# Patient Record
Sex: Female | Born: 1965
Health system: Southern US, Community
[De-identification: ages and names within clinical notes are randomized; demographics above are authoritative.]

## PROBLEM LIST (undated history)

## (undated) DIAGNOSIS — J189 Pneumonia, unspecified organism: Secondary | ICD-10-CM

## (undated) DIAGNOSIS — G4730 Sleep apnea, unspecified: Secondary | ICD-10-CM

## (undated) DIAGNOSIS — R7303 Prediabetes: Secondary | ICD-10-CM

## (undated) DIAGNOSIS — N189 Chronic kidney disease, unspecified: Secondary | ICD-10-CM

## (undated) DIAGNOSIS — E039 Hypothyroidism, unspecified: Secondary | ICD-10-CM

## (undated) DIAGNOSIS — M1990 Unspecified osteoarthritis, unspecified site: Secondary | ICD-10-CM

## (undated) DIAGNOSIS — K219 Gastro-esophageal reflux disease without esophagitis: Secondary | ICD-10-CM

## (undated) DIAGNOSIS — Z87442 Personal history of urinary calculi: Secondary | ICD-10-CM

## (undated) DIAGNOSIS — E079 Disorder of thyroid, unspecified: Secondary | ICD-10-CM

## (undated) DIAGNOSIS — I10 Essential (primary) hypertension: Secondary | ICD-10-CM

## (undated) HISTORY — PX: TUBAL LIGATION: SHX77

## (undated) HISTORY — PX: BREAST LUMPECTOMY: SHX2

## (undated) HISTORY — PX: ABDOMINAL HYSTERECTOMY: SHX81

## (undated) HISTORY — PX: WISDOM TOOTH EXTRACTION: SHX21

---

## 1998-06-23 ENCOUNTER — Emergency Department (HOSPITAL_COMMUNITY): Admission: EM | Admit: 1998-06-23 | Discharge: 1998-06-23 | Payer: Self-pay | Admitting: Emergency Medicine

## 1999-11-07 ENCOUNTER — Emergency Department (HOSPITAL_COMMUNITY): Admission: EM | Admit: 1999-11-07 | Discharge: 1999-11-08 | Payer: Self-pay | Admitting: Emergency Medicine

## 1999-11-08 ENCOUNTER — Encounter: Payer: Self-pay | Admitting: Emergency Medicine

## 1999-12-03 ENCOUNTER — Ambulatory Visit (HOSPITAL_COMMUNITY): Admission: RE | Admit: 1999-12-03 | Discharge: 1999-12-03 | Payer: Self-pay | Admitting: Endocrinology

## 1999-12-03 ENCOUNTER — Encounter: Payer: Self-pay | Admitting: Endocrinology

## 2000-07-07 ENCOUNTER — Encounter: Payer: Self-pay | Admitting: Endocrinology

## 2000-07-07 ENCOUNTER — Ambulatory Visit (HOSPITAL_COMMUNITY): Admission: RE | Admit: 2000-07-07 | Discharge: 2000-07-07 | Payer: Self-pay | Admitting: Endocrinology

## 2000-07-28 ENCOUNTER — Other Ambulatory Visit: Admission: RE | Admit: 2000-07-28 | Discharge: 2000-07-28 | Payer: Self-pay | Admitting: *Deleted

## 2000-08-05 ENCOUNTER — Encounter: Payer: Self-pay | Admitting: Endocrinology

## 2000-08-05 ENCOUNTER — Ambulatory Visit (HOSPITAL_COMMUNITY): Admission: RE | Admit: 2000-08-05 | Discharge: 2000-08-05 | Payer: Self-pay | Admitting: Endocrinology

## 2005-02-02 ENCOUNTER — Emergency Department (HOSPITAL_COMMUNITY): Admission: EM | Admit: 2005-02-02 | Discharge: 2005-02-02 | Payer: Self-pay | Admitting: *Deleted

## 2005-04-12 ENCOUNTER — Emergency Department (HOSPITAL_COMMUNITY): Admission: EM | Admit: 2005-04-12 | Discharge: 2005-04-12 | Payer: Self-pay | Admitting: Emergency Medicine

## 2008-03-17 ENCOUNTER — Emergency Department (HOSPITAL_BASED_OUTPATIENT_CLINIC_OR_DEPARTMENT_OTHER): Admission: EM | Admit: 2008-03-17 | Discharge: 2008-03-17 | Payer: Self-pay | Admitting: Emergency Medicine

## 2010-04-07 ENCOUNTER — Emergency Department (HOSPITAL_BASED_OUTPATIENT_CLINIC_OR_DEPARTMENT_OTHER): Admission: EM | Admit: 2010-04-07 | Discharge: 2010-04-07 | Payer: Self-pay | Admitting: Emergency Medicine

## 2011-02-05 LAB — URINALYSIS, ROUTINE W REFLEX MICROSCOPIC
Glucose, UA: NEGATIVE
Ketones, ur: 15 — AB
Nitrite: NEGATIVE
Protein, ur: 30 — AB
Specific Gravity, Urine: 1.027
Urobilinogen, UA: 1
pH: 6

## 2011-02-05 LAB — URINE MICROSCOPIC-ADD ON

## 2012-07-21 ENCOUNTER — Emergency Department (HOSPITAL_BASED_OUTPATIENT_CLINIC_OR_DEPARTMENT_OTHER)
Admission: EM | Admit: 2012-07-21 | Discharge: 2012-07-21 | Disposition: A | Discharge status: Home / Self Care | Payer: 59 | Attending: Emergency Medicine | Admitting: Emergency Medicine

## 2012-07-21 ENCOUNTER — Encounter (HOSPITAL_BASED_OUTPATIENT_CLINIC_OR_DEPARTMENT_OTHER): Payer: Self-pay | Admitting: Emergency Medicine

## 2012-07-21 DIAGNOSIS — K047 Periapical abscess without sinus: Secondary | ICD-10-CM | POA: Insufficient documentation

## 2012-07-21 MED ORDER — HYDROCODONE-ACETAMINOPHEN 5-325 MG PO TABS: 2.0000 | ORAL_TABLET | ORAL | Status: DC | PRN

## 2012-07-21 MED ORDER — CLINDAMYCIN HCL 150 MG PO CAPS: 150.0000 mg | ORAL_CAPSULE | Freq: Four times a day (QID) | ORAL | Status: DC

## 2012-07-21 NOTE — ED Notes (Signed)
Pt states she has right side facial swelling that started today a few hours ago.

## 2012-07-21 NOTE — ED Notes (Signed)
Noted right side facial swelling, Pt states no pain is involved with swelling.

## 2012-07-21 NOTE — ED Provider Notes (Signed)
History     CSN: [id number]  Arrival date & time 07/21/12  1533   First MD Initiated Contact with Patient 07/21/12 1544      Chief Complaint  Patient presents with  . Facial Swelling    (Consider location/radiation/quality/duration/timing/severity/associated sxs/prior treatment) HPI Comments: Pt state that she noticed right sided facial swelling that started this morning:pt denies fever, trouble swallowing or breathing;pt states that she does have a problem with a right lower tooth:pt denies history of similar symptoms:pt denies having tried any medications  The history is provided by the patient. No language interpreter was used.    History reviewed. No pertinent past medical history.  Past Surgical History  Procedure Laterality Date  . Abdominal hysterectomy      Family History  Problem Relation Age of Onset  . Hypertension Mother   . Diabetes Mother   . Hypertension Father     History  Substance Use Topics  . Smoking status: Not on file  . Smokeless tobacco: Not on file  . Alcohol Use: Yes    OB History   Grav Para Term Preterm Abortions TAB SAB Ect Mult Living                  Review of Systems  Constitutional: Negative.   Respiratory: Negative.   Cardiovascular: Negative.     Allergies  Review of patient's allergies indicates not on file.  Home Medications  No current outpatient prescriptions on file.  BP 136/89  Pulse 93  Temp(Src) 98.2 F (36.8 C) (Oral)  Resp 17  Ht 5\' 5"  (1.651 m)  Wt 200 lb (90.719 kg)  BMI 33.28 kg/m2  SpO2 96%  Physical Exam  Nursing note and vitals reviewed. Constitutional: She is oriented to person, place, and time. She appears well-developed and well-nourished.  HENT:  Head: Normocephalic and atraumatic.  Right Ear: External ear normal.  Left Ear: External ear normal.  Mouth/Throat:    Swelling noted along the right lower jaw  Cardiovascular: Normal rate and regular rhythm.   Pulmonary/Chest: Effort  normal and breath sounds normal.  Musculoskeletal: Normal range of motion.  Neurological: She is alert and oriented to person, place, and time.    ED Course  Procedures (including critical care time)  Labs Reviewed - No data to display No results found.   1. Dental abscess       MDM  No sign of ludwigs angina:will treat with clinda and something for pain and refer to dentist        Teressa Lower, NP 07/21/12 1605

## 2012-07-21 NOTE — ED Provider Notes (Signed)
Medical screening examination/treatment/procedure(s) were performed by non-physician practitioner and as supervising physician I was immediately available for consultation/collaboration.   Richardean Canal, MD 07/21/12 [id number]

## 2013-12-20 ENCOUNTER — Encounter: Payer: Self-pay | Admitting: Sports Medicine

## 2013-12-20 ENCOUNTER — Ambulatory Visit (INDEPENDENT_AMBULATORY_CARE_PROVIDER_SITE_OTHER): Payer: 59 | Admitting: Sports Medicine

## 2013-12-20 VITALS — BP 154/97 | HR 93 | Ht 65.0 in | Wt 206.0 lb

## 2013-12-20 DIAGNOSIS — M170 Bilateral primary osteoarthritis of knee: Secondary | ICD-10-CM | POA: Insufficient documentation

## 2013-12-20 DIAGNOSIS — M1710 Unilateral primary osteoarthritis, unspecified knee: Secondary | ICD-10-CM

## 2013-12-20 MED ORDER — DICLOFENAC SODIUM 2 % TD SOLN: 2.0000 | Freq: Two times a day (BID) | TRANSDERMAL | Status: DC

## 2013-12-20 NOTE — Addendum Note (Signed)
Addended by: Monica BectonHEKKEKANDAM, Tanaya Dunigan J on: 12/20/2013 12:00 PM   Modules accepted: Orders

## 2013-12-20 NOTE — Progress Notes (Signed)
   Subjective:    I'm seeing this patient as a consultation for:  Dr. Carlynn PurlPerez  CC: Bilateral knee pain  HPI: This is a very pleasant 48 year old female, she's had several years of knee pain, bilateral, localized to the joint lines, moderate, persistent without radiation, with crepitus. No trauma. [place]. Mobic, ibuprofen, and Aleve were ineffective.  Past medical history, Surgical history, Family history not pertinant except as noted below, Social history, Allergies, and medications have been entered into the medical record, reviewed, and no changes needed.   Review of Systems: No headache, visual changes, nausea, vomiting, diarrhea, constipation, dizziness, abdominal pain, skin rash, fevers, chills, night sweats, weight loss, swollen lymph nodes, body aches, joint swelling, muscle aches, chest pain, shortness of breath, mood changes, visual or auditory hallucinations.   Objective:   General: Well Developed, well nourished, and in no acute distress.  Neuro/Psych: Alert and oriented x3, extra-ocular muscles intact, able to move all 4 extremities, sensation grossly intact. Skin: Warm and dry, no rashes noted.  Respiratory: Not using accessory muscles, speaking in full sentences, trachea midline.  Cardiovascular: Pulses palpable, no extremity edema. Abdomen: Does not appear distended. Bilateral Knee: Normal to inspection with no erythema or effusion or obvious bony abnormalities. Minimal effusion in the right knee, tender to palpation at the joint line bilaterally. ROM normal in flexion and extension and lower leg rotation. Ligaments with solid consistent endpoints including ACL, PCL, LCL, MCL. Negative Mcmurray's and provocative meniscal tests. Non painful patellar compression. Patellar and quadriceps tendons unremarkable. Hamstring and quadriceps strength is normal.  Procedure: Real-time Ultrasound Guided Injection of left  knee Device: GE Logiq E  Verbal informed consent obtained.  Time-out conducted.  Noted no overlying erythema, induration, or other signs of local infection.  Skin prepped in a sterile fashion.  Local anesthesia: Topical Ethyl chloride.  With sterile technique and under real time ultrasound guidance:  2 cc kenalog 40, 4 cc lidocaine injected easily into the suprapatellar recess. Completed without difficulty  Pain immediately resolved suggesting accurate placement of the medication.  Advised to call if fevers/chills, erythema, induration, drainage, or persistent bleeding.  Images permanently stored and available for review in the ultrasound unit.  Impression: Technically successful ultrasound guided injection.  Procedure: Real-time Ultrasound Guided Injection of right knee Device: GE Logiq E  Verbal informed consent obtained.  Time-out conducted.  Noted no overlying erythema, induration, or other signs of local infection.  Skin prepped in a sterile fashion.  Local anesthesia: Topical Ethyl chloride.  With sterile technique and under real time ultrasound guidance:  2 cc kenalog 40, 4 cc lidocaine injected easily into the suprapatellar recess. Completed without difficulty  Pain immediately resolved suggesting accurate placement of the medication.  Advised to call if fevers/chills, erythema, induration, drainage, or persistent bleeding.  Images permanently stored and available for review in the ultrasound unit.  Impression: Technically successful ultrasound guided injection.  Impression and Recommendations:   This case required medical decision making of moderate complexity.

## 2013-12-20 NOTE — Assessment & Plan Note (Addendum)
Bilateral injection. Formal physical therapy at the [place] location, return to see me for custom orthotics. Aleve, ibuprofen, Mobic were ineffective, topical diclofenac did help at 1%, I am going to call in 2%.

## 2013-12-21 ENCOUNTER — Telehealth: Payer: Self-pay | Admitting: Sports Medicine

## 2013-12-21 ENCOUNTER — Encounter (HOSPITAL_BASED_OUTPATIENT_CLINIC_OR_DEPARTMENT_OTHER): Payer: Self-pay | Admitting: Emergency Medicine

## 2013-12-21 DIAGNOSIS — F17200 Nicotine dependence, unspecified, uncomplicated: Secondary | ICD-10-CM | POA: Insufficient documentation

## 2013-12-21 DIAGNOSIS — R221 Localized swelling, mass and lump, neck: Secondary | ICD-10-CM | POA: Diagnosis present

## 2013-12-21 DIAGNOSIS — R220 Localized swelling, mass and lump, head: Secondary | ICD-10-CM | POA: Diagnosis present

## 2013-12-21 NOTE — Telephone Encounter (Signed)
Flushing can be a normal physiologic response after steroid injection, sit in a cool room, take some Benadryl. This will resolve.

## 2013-12-21 NOTE — ED Notes (Signed)
Pt reports that she has had burning and swelling in her face since about 2pm today and shortness of breath since about 7pm. Pt denies hives/itching.Took Benadryl at 1830 and Tylenol at 2100. Pt says that yesterday she had two cortisone shots in her knees.

## 2013-12-21 NOTE — Telephone Encounter (Signed)
Ms. Colleen Kemp called. She states she is feeling hot after steroid injection yesterday. I told pt per Dr T that she is feeling flushed from steroid and to take Benadryl.

## 2013-12-22 ENCOUNTER — Emergency Department (HOSPITAL_BASED_OUTPATIENT_CLINIC_OR_DEPARTMENT_OTHER)
Admission: EM | Admit: 2013-12-22 | Discharge: 2013-12-22 | Discharge status: Against Medical Advice | Payer: 59 | Attending: Emergency Medicine | Admitting: Emergency Medicine

## 2013-12-22 NOTE — Telephone Encounter (Signed)
Spoke to patient advised her of the instructions as noted below. Rhonda Cunningham,CMA

## 2013-12-28 ENCOUNTER — Ambulatory Visit (INDEPENDENT_AMBULATORY_CARE_PROVIDER_SITE_OTHER): Payer: 59 | Admitting: Sports Medicine

## 2013-12-28 ENCOUNTER — Encounter: Payer: Self-pay | Admitting: Sports Medicine

## 2013-12-28 VITALS — BP 142/96 | HR 81 | Ht 65.0 in | Wt 207.0 lb

## 2013-12-28 DIAGNOSIS — M170 Bilateral primary osteoarthritis of knee: Secondary | ICD-10-CM

## 2013-12-28 DIAGNOSIS — M1710 Unilateral primary osteoarthritis, unspecified knee: Secondary | ICD-10-CM

## 2013-12-28 NOTE — Progress Notes (Signed)

## 2013-12-28 NOTE — Assessment & Plan Note (Addendum)
Custom orthotics as above. Right knee is pain-free, left knee still has a bit of pain. Has not yet started physical therapy, return in 4 weeks.

## 2014-01-04 ENCOUNTER — Ambulatory Visit: Payer: 59 | Attending: Sports Medicine | Admitting: Physical Therapy

## 2014-01-04 DIAGNOSIS — R269 Unspecified abnormalities of gait and mobility: Secondary | ICD-10-CM | POA: Diagnosis not present

## 2014-01-04 DIAGNOSIS — IMO0001 Reserved for inherently not codable concepts without codable children: Secondary | ICD-10-CM | POA: Diagnosis present

## 2014-01-04 DIAGNOSIS — M25569 Pain in unspecified knee: Secondary | ICD-10-CM | POA: Diagnosis not present

## 2014-01-12 ENCOUNTER — Ambulatory Visit: Payer: 59 | Admitting: Rehabilitation

## 2014-01-12 DIAGNOSIS — IMO0001 Reserved for inherently not codable concepts without codable children: Secondary | ICD-10-CM | POA: Diagnosis not present

## 2014-01-13 ENCOUNTER — Ambulatory Visit: Payer: 59 | Admitting: Physical Therapy

## 2014-01-13 DIAGNOSIS — IMO0001 Reserved for inherently not codable concepts without codable children: Secondary | ICD-10-CM | POA: Diagnosis not present

## 2014-01-17 ENCOUNTER — Ambulatory Visit: Payer: 59 | Admitting: Rehabilitation

## 2014-01-17 DIAGNOSIS — IMO0001 Reserved for inherently not codable concepts without codable children: Secondary | ICD-10-CM | POA: Diagnosis not present

## 2014-01-20 ENCOUNTER — Ambulatory Visit: Payer: 59 | Admitting: Physical Therapy

## 2014-01-24 ENCOUNTER — Ambulatory Visit: Payer: 59 | Admitting: Rehabilitation

## 2014-01-24 DIAGNOSIS — IMO0001 Reserved for inherently not codable concepts without codable children: Secondary | ICD-10-CM | POA: Diagnosis not present

## 2014-01-27 ENCOUNTER — Ambulatory Visit: Payer: 59 | Admitting: Physical Therapy

## 2014-01-28 ENCOUNTER — Ambulatory Visit (INDEPENDENT_AMBULATORY_CARE_PROVIDER_SITE_OTHER): Payer: 59 | Admitting: Sports Medicine

## 2014-01-28 ENCOUNTER — Encounter: Payer: Self-pay | Admitting: Sports Medicine

## 2014-01-28 VITALS — BP 151/95 | HR 101 | Ht 65.0 in | Wt 205.0 lb

## 2014-01-28 DIAGNOSIS — M1710 Unilateral primary osteoarthritis, unspecified knee: Secondary | ICD-10-CM

## 2014-01-28 DIAGNOSIS — M170 Bilateral primary osteoarthritis of knee: Secondary | ICD-10-CM

## 2014-01-28 MED ORDER — TRAMADOL HCL 50 MG PO TABS: ORAL_TABLET | ORAL | Status: DC

## 2014-01-28 NOTE — Assessment & Plan Note (Signed)
Is doing okay after injection, orthotics, physical therapy. Left knee still has some pain. Next step would be Visco supplementation, she is going to [place] on Wednesday so I am happy to inject her knee on Tuesday. She returns we can start Visco supplementation.  Tramadol for pain in the meantime.

## 2014-01-28 NOTE — Progress Notes (Signed)
  Subjective:    CC: Followup  HPI: Bilateral knee osteoarthritis: Right knee is pain-free after injection and orthotics, left knee continues to have pain. Pain is moderate, persistent. She does have a trip coming up to [place] next week, pain is moderate, persistent.  Past medical history, Surgical history, Family history not pertinant except as noted below, Social history, Allergies, and medications have been entered into the medical record, reviewed, and no changes needed.   Review of Systems: No fevers, chills, night sweats, weight loss, chest pain, or shortness of breath.   Objective:    General: Well Developed, well nourished, and in no acute distress.  Neuro: Alert and oriented x3, extra-ocular muscles intact, sensation grossly intact.  HEENT: Normocephalic, atraumatic, pupils equal round reactive to light, neck supple, no masses, no lymphadenopathy, thyroid nonpalpable.  Skin: Warm and dry, no rashes. Cardiac: Regular rate and rhythm, no murmurs rubs or gallops, no lower extremity edema.  Respiratory: Clear to auscultation bilaterally. Not using accessory muscles, speaking in full sentences.  Impression and Recommendations:

## 2014-01-31 ENCOUNTER — Ambulatory Visit: Payer: 59 | Admitting: Rehabilitation

## 2014-02-01 ENCOUNTER — Ambulatory Visit (INDEPENDENT_AMBULATORY_CARE_PROVIDER_SITE_OTHER): Payer: 59 | Admitting: Sports Medicine

## 2014-02-01 ENCOUNTER — Encounter: Payer: Self-pay | Admitting: Sports Medicine

## 2014-02-01 ENCOUNTER — Ambulatory Visit: Payer: 59 | Admitting: Sports Medicine

## 2014-02-01 VITALS — BP 147/85 | HR 93 | Wt 205.0 lb

## 2014-02-01 DIAGNOSIS — M1710 Unilateral primary osteoarthritis, unspecified knee: Secondary | ICD-10-CM

## 2014-02-01 DIAGNOSIS — M170 Bilateral primary osteoarthritis of knee: Secondary | ICD-10-CM

## 2014-02-01 NOTE — Progress Notes (Signed)
  Subjective:    CC: Followup  HPI: Bilateral knee osteoarthritis: Right knee is doing okay, left knee still hurts. She is going to [place] tomorrow, she desires repeat knee injection today. She is also a candidate now for Visco supplementation. Pain is worse in the morning, with gelling, moderate, persistent and worse at the joint line. No radiation.  Past medical history, Surgical history, Family history not pertinant except as noted below, Social history, Allergies, and medications have been entered into the medical record, reviewed, and no changes needed.   Review of Systems: No fevers, chills, night sweats, weight loss, chest pain, or shortness of breath.   Objective:    General: Well Developed, well nourished, and in no acute distress.  Neuro: Alert and oriented x3, extra-ocular muscles intact, sensation grossly intact.  HEENT: Normocephalic, atraumatic, pupils equal round reactive to light, neck supple, no masses, no lymphadenopathy, thyroid nonpalpable.  Skin: Warm and dry, no rashes. Cardiac: Regular rate and rhythm, no murmurs rubs or gallops, no lower extremity edema.  Respiratory: Clear to auscultation bilaterally. Not using accessory muscles, speaking in full sentences.  Procedure: Real-time Ultrasound Guided aspiration/Injection of left knee Device: GE Logiq E  Verbal informed consent obtained.  Time-out conducted.  Noted no overlying erythema, induration, or other signs of local infection.  Skin prepped in a sterile fashion.  Local anesthesia: Topical Ethyl chloride.  With sterile technique and under real time ultrasound guidance:  20 cc of straw-colored fluid aspirated, syringe switched in 2 cc Kenalog 40, 4 cc lidocaine injected easily, syringe again switched and 30 mg/2 mL of OrthoVisc (sodium hyaluronate) in a prefilled syringe was injected easily into the knee through a 22-gauge needle. Completed without difficulty  Pain immediately resolved suggesting accurate  placement of the medication.  Advised to call if fevers/chills, erythema, induration, drainage, or persistent bleeding.  Images permanently stored and available for review in the ultrasound unit.  Impression: Technically successful ultrasound guided injection.  Impression and Recommendations:

## 2014-02-01 NOTE — Assessment & Plan Note (Signed)
Aspiration and steroid injection, OrthoVisc injection #1. Return in one week for #2. She is going to [place] for the next week.

## 2014-02-03 ENCOUNTER — Ambulatory Visit: Payer: 59 | Admitting: Physical Therapy

## 2014-02-07 ENCOUNTER — Ambulatory Visit: Payer: 59 | Attending: Sports Medicine | Admitting: Rehabilitation

## 2014-02-07 DIAGNOSIS — M25561 Pain in right knee: Secondary | ICD-10-CM | POA: Insufficient documentation

## 2014-02-07 DIAGNOSIS — R269 Unspecified abnormalities of gait and mobility: Secondary | ICD-10-CM | POA: Insufficient documentation

## 2014-02-07 DIAGNOSIS — M25562 Pain in left knee: Secondary | ICD-10-CM | POA: Insufficient documentation

## 2014-02-07 DIAGNOSIS — Z5189 Encounter for other specified aftercare: Secondary | ICD-10-CM | POA: Insufficient documentation

## 2014-02-08 ENCOUNTER — Ambulatory Visit: Payer: 59 | Admitting: Sports Medicine

## 2014-02-10 ENCOUNTER — Ambulatory Visit: Payer: 59 | Admitting: Physical Therapy

## 2014-02-14 ENCOUNTER — Encounter: Payer: Self-pay | Admitting: Sports Medicine

## 2014-02-14 ENCOUNTER — Ambulatory Visit (INDEPENDENT_AMBULATORY_CARE_PROVIDER_SITE_OTHER): Payer: 59 | Admitting: Sports Medicine

## 2014-02-14 ENCOUNTER — Ambulatory Visit: Payer: 59 | Admitting: Rehabilitation

## 2014-02-14 VITALS — BP 137/90 | HR 87 | Ht 65.0 in | Wt 206.0 lb

## 2014-02-14 DIAGNOSIS — M170 Bilateral primary osteoarthritis of knee: Secondary | ICD-10-CM

## 2014-02-14 NOTE — Assessment & Plan Note (Signed)
She was pain-free during her to make a trip. Aspiration and OrthoVisc injection #2 of 4 into the left knee. Return in one week for #3.

## 2014-02-14 NOTE — Progress Notes (Signed)
  Procedure: Real-time Ultrasound Guided aspiration/Injection of left knee Device: GE Logiq E  Verbal informed consent obtained.  Time-out conducted.  Noted no overlying erythema, induration, or other signs of local infection.  Skin prepped in a sterile fashion.  Local anesthesia: Topical Ethyl chloride.  With sterile technique and under real time ultrasound guidance:  Aspirated 15 cc fo of straw-colored fluid, syringe switched and 30 mg/2 mL of OrthoVisc (sodium hyaluronate) in a prefilled syringe was injected easily into the knee through a 22-gauge needle. Completed without difficulty  Pain immediately resolved suggesting accurate placement of the medication.  Advised to call if fevers/chills, erythema, induration, drainage, or persistent bleeding.  Images permanently stored and available for review in the ultrasound unit.  Impression: Technically successful ultrasound guided injection.

## 2014-02-17 ENCOUNTER — Ambulatory Visit: Payer: 59 | Admitting: Physical Therapy

## 2014-02-21 ENCOUNTER — Ambulatory Visit (INDEPENDENT_AMBULATORY_CARE_PROVIDER_SITE_OTHER): Payer: 59 | Admitting: Sports Medicine

## 2014-02-21 ENCOUNTER — Encounter: Payer: Self-pay | Admitting: Sports Medicine

## 2014-02-21 VITALS — BP 146/96 | HR 90 | Ht 65.0 in | Wt 207.0 lb

## 2014-02-21 DIAGNOSIS — M170 Bilateral primary osteoarthritis of knee: Secondary | ICD-10-CM

## 2014-02-21 NOTE — Progress Notes (Signed)
  Procedure: Real-time Ultrasound Guided aspiration/Injection of left knee Device: GE Logiq E  Verbal informed consent obtained.  Time-out conducted.  Noted no overlying erythema, induration, or other signs of local infection.  Skin prepped in a sterile fashion.  Local anesthesia: Topical Ethyl chloride.  With sterile technique and under real time ultrasound guidance:  Aspirated 25 cc fo of straw-colored fluid, syringe switched and 30 mg/2 mL of OrthoVisc (sodium hyaluronate) in a prefilled syringe was injected easily into the knee through a 22-gauge needle. Completed without difficulty  Pain immediately resolved suggesting accurate placement of the medication.  Advised to call if fevers/chills, erythema, induration, drainage, or persistent bleeding.  Images permanently stored and available for review in the ultrasound unit.  Impression: Technically successful ultrasound guided injection.

## 2014-02-21 NOTE — Assessment & Plan Note (Addendum)
Aspiration and orthovisc injection number 3 into the left knee. Currently pain-free. Return in one week for #4.

## 2014-02-28 ENCOUNTER — Ambulatory Visit (INDEPENDENT_AMBULATORY_CARE_PROVIDER_SITE_OTHER): Payer: 59 | Admitting: Sports Medicine

## 2014-02-28 ENCOUNTER — Encounter: Payer: Self-pay | Admitting: Sports Medicine

## 2014-02-28 VITALS — BP 142/88 | HR 91 | Ht 65.0 in | Wt 203.0 lb

## 2014-02-28 DIAGNOSIS — M170 Bilateral primary osteoarthritis of knee: Secondary | ICD-10-CM

## 2014-02-28 NOTE — Progress Notes (Signed)
  Procedure: Real-time Ultrasound Guided aspiration/Injection of left knee Device: GE Logiq E  Verbal informed consent obtained.  Time-out conducted.  Noted no overlying erythema, induration, or other signs of local infection.  Skin prepped in a sterile fashion.  Local anesthesia: Topical Ethyl chloride.  With sterile technique and under real time ultrasound guidance:  Aspirated 12 cc of straw-colored fluid, syringe switched and 30 mg/2 mL of OrthoVisc (sodium hyaluronate) in a prefilled syringe was injected easily into the knee through a 22-gauge needle. Completed without difficulty  Pain immediately resolved suggesting accurate placement of the medication.  Advised to call if fevers/chills, erythema, induration, drainage, or persistent bleeding.  Images permanently stored and available for review in the ultrasound unit.  Impression: Technically successful ultrasound guided injection.

## 2014-02-28 NOTE — Assessment & Plan Note (Signed)
Aspiration and Orthovisc injection #4 of 4, currently pain-free. Return as needed.

## 2014-03-11 ENCOUNTER — Telehealth: Payer: Self-pay

## 2014-03-11 MED ORDER — IBUPROFEN 800 MG PO TABS: 800.0000 mg | ORAL_TABLET | Freq: Three times a day (TID) | ORAL | Status: DC | PRN

## 2014-03-11 NOTE — Telephone Encounter (Signed)
Ibuprofen 800 mg called in.

## 2014-03-11 NOTE — Telephone Encounter (Signed)
Patient called state that Tramadol is not working for her she would like to have her Rx changed from Tramadol to Ibuprofen. Jefry Lesinski,CMA

## 2014-03-11 NOTE — Telephone Encounter (Signed)
Spoke to patient gave her information as noted below. Colleen Kemp,CMA  

## 2014-10-26 ENCOUNTER — Encounter: Payer: Self-pay | Admitting: Sports Medicine

## 2014-10-26 ENCOUNTER — Ambulatory Visit (INDEPENDENT_AMBULATORY_CARE_PROVIDER_SITE_OTHER): Payer: 59 | Admitting: Sports Medicine

## 2014-10-26 VITALS — BP 149/105 | HR 85 | Ht 66.5 in | Wt 215.0 lb

## 2014-10-26 DIAGNOSIS — M170 Bilateral primary osteoarthritis of knee: Secondary | ICD-10-CM | POA: Diagnosis not present

## 2014-10-26 NOTE — Assessment & Plan Note (Signed)
Bilateral knee joint aspiration, steroidal injection and/or the Visco injection as above. Return in one week for bilateral Orthovisc injection #2 of 4.

## 2014-10-26 NOTE — Progress Notes (Signed)
  Subjective:    CC: bilateral knee pain  HPI: This is a pleasant 49 year old female, she has bilateral knee osteoarthritis, her right knee did well initially with a steroidal injection over a year ago and her left needed well with viscous supplementation. She now has swelling and pain in both knees, moderate, persistent, at the joint lines, no radiation. She is amenable to proceed with interventional treatment for both knees.  Past medical history, Surgical history, Family history not pertinant except as noted below, Social history, Allergies, and medications have been entered into the medical record, reviewed, and no changes needed.   Review of Systems: No fevers, chills, night sweats, weight loss, chest pain, or shortness of breath.   Objective:    General: Well Developed, well nourished, and in no acute distress.  Neuro: Alert and oriented x3, extra-ocular muscles intact, sensation grossly intact.  HEENT: Normocephalic, atraumatic, pupils equal round reactive to light, neck supple, no masses, no lymphadenopathy, thyroid nonpalpable.  Skin: Warm and dry, no rashes. Cardiac: Regular rate and rhythm, no murmurs rubs or gallops, no lower extremity edema.  Respiratory: Clear to auscultation bilaterally. Not using accessory muscles, speaking in full sentences. Bilateral Knees: Visibly swollen with a palpable fluid wave and tenderness at the medial joint lines ROM normal in flexion and extension and lower leg rotation. Ligaments with solid consistent endpoints including ACL, PCL, LCL, MCL. Negative Mcmurray's and provocative meniscal tests. Non painful patellar compression. Patellar and quadriceps tendons unremarkable. Hamstring and quadriceps strength is normal.  Procedure: Real-time Ultrasound Guided Injection of left knee Device: GE Logiq E  Verbal informed consent obtained.  Time-out conducted.  Noted no overlying erythema, induration, or other signs of local infection.  Skin  prepped in a sterile fashion.  Local anesthesia: Topical Ethyl chloride.  With sterile technique and under real time ultrasound guidance:  Aspirated 45 mL of straw-colored fluid, syringe switched and 1 mL kenalog 40, 4 mL lidocaine injected easily, syringe again switched and 30 mg/2 mL of OrthoVisc (sodium hyaluronate) in a prefilled syringe was injected easily into the knee through a 22-gauge needle. Completed without difficulty  Pain immediately resolved suggesting accurate placement of the medication.  Advised to call if fevers/chills, erythema, induration, drainage, or persistent bleeding.  Images permanently stored and available for review in the ultrasound unit.  Impression: Technically successful ultrasound guided injection.  Procedure: Real-time Ultrasound Guided Injection of right knee Device: GE Logiq E  Verbal informed consent obtained.  Time-out conducted.  Noted no overlying erythema, induration, or other signs of local infection.  Skin prepped in a sterile fashion.  Local anesthesia: Topical Ethyl chloride.  With sterile technique and under real time ultrasound guidance:  Aspirated 57mL of straw-colored fluid, syringe switched and 1 mL kenalog 40, 4 mL lidocaine injected easily, syringe again switched and 30 mg/2 mL of OrthoVisc (sodium hyaluronate) in a prefilled syringe was injected easily into the knee through a 22-gauge needle. Completed without difficulty  Pain immediately resolved suggesting accurate placement of the medication.  Advised to call if fevers/chills, erythema, induration, drainage, or persistent bleeding.  Images permanently stored and available for review in the ultrasound unit.  Impression: Technically successful ultrasound guided injection.  Impression and Recommendations:

## 2014-11-03 ENCOUNTER — Ambulatory Visit (INDEPENDENT_AMBULATORY_CARE_PROVIDER_SITE_OTHER): Payer: 59 | Admitting: Sports Medicine

## 2014-11-03 ENCOUNTER — Encounter: Payer: Self-pay | Admitting: Sports Medicine

## 2014-11-03 VITALS — BP 147/96 | HR 81 | Ht 65.5 in | Wt 211.0 lb

## 2014-11-03 DIAGNOSIS — M170 Bilateral primary osteoarthritis of knee: Secondary | ICD-10-CM | POA: Diagnosis not present

## 2014-11-03 NOTE — Assessment & Plan Note (Signed)
Orthovisc injection #2 of 4 into each knee. Return in one week for #3

## 2014-11-03 NOTE — Progress Notes (Signed)
  Procedure: Real-time Ultrasound Guided Injection of left knee Device: GE Logiq E  Verbal informed consent obtained.  Time-out conducted.  Noted no overlying erythema, induration, or other signs of local infection.  Skin prepped in a sterile fashion.  Local anesthesia: Topical Ethyl chloride.  With sterile technique and under real time ultrasound guidance:  Aspirated 20 mL of straw-colored fluid, syringe switched and  30 mg/2 mL of OrthoVisc (sodium hyaluronate) in a prefilled syringe was injected easily into the knee through a 22-gauge needle. Completed without difficulty  Pain immediately resolved suggesting accurate placement of the medication.  Advised to call if fevers/chills, erythema, induration, drainage, or persistent bleeding.  Images permanently stored and available for review in the ultrasound unit.  Impression: Technically successful ultrasound guided injection.  Procedure: Real-time Ultrasound Guided Injection of right knee Device: GE Logiq E  Verbal informed consent obtained.  Time-out conducted.  Noted no overlying erythema, induration, or other signs of local infection.  Skin prepped in a sterile fashion.  Local anesthesia: Topical Ethyl chloride.  With sterile technique and under real time ultrasound guidance:  Aspirated 15mL of straw-colored fluid, syringe switched and  30 mg/2 mL of OrthoVisc (sodium hyaluronate) in a prefilled syringe was injected easily into the knee through a 22-gauge needle. Completed without difficulty  Pain immediately resolved suggesting accurate placement of the medication.  Advised to call if fevers/chills, erythema, induration, drainage, or persistent bleeding.  Images permanently stored and available for review in the ultrasound unit.  Impression: Technically successful ultrasound guided injection.

## 2014-11-11 ENCOUNTER — Ambulatory Visit (INDEPENDENT_AMBULATORY_CARE_PROVIDER_SITE_OTHER): Payer: 59 | Admitting: Sports Medicine

## 2014-11-11 ENCOUNTER — Encounter: Payer: Self-pay | Admitting: Sports Medicine

## 2014-11-11 DIAGNOSIS — M170 Bilateral primary osteoarthritis of knee: Secondary | ICD-10-CM

## 2014-11-11 MED ORDER — IBUPROFEN 800 MG PO TABS: 800.0000 mg | ORAL_TABLET | Freq: Three times a day (TID) | ORAL | Status: DC | PRN

## 2014-11-11 NOTE — Progress Notes (Signed)
  Procedure: Real-time Ultrasound Guided Injection of left knee Device: GE Logiq E  Verbal informed consent obtained.  Time-out conducted.  Noted no overlying erythema, induration, or other signs of local infection.  Skin prepped in a sterile fashion.  Local anesthesia: Topical Ethyl chloride.  With sterile technique and under real time ultrasound guidance:  Aspirated 17 mL of straw-colored fluid, syringe switched and  30 mg/2 mL of OrthoVisc (sodium hyaluronate) in a prefilled syringe was injected easily into the knee through a 22-gauge needle. Completed without difficulty  Pain immediately resolved suggesting accurate placement of the medication.  Advised to call if fevers/chills, erythema, induration, drainage, or persistent bleeding.  Images permanently stored and available for review in the ultrasound unit.  Impression: Technically successful ultrasound guided injection.  Procedure: Real-time Ultrasound Guided Injection of right knee Device: GE Logiq E  Verbal informed consent obtained.  Time-out conducted.  Noted no overlying erythema, induration, or other signs of local infection.  Skin prepped in a sterile fashion.  Local anesthesia: Topical Ethyl chloride.  With sterile technique and under real time ultrasound guidance:  Aspirated 14mL of straw-colored fluid, syringe switched and  30 mg/2 mL of OrthoVisc (sodium hyaluronate) in a prefilled syringe was injected easily into the knee through a 22-gauge needle. Completed without difficulty  Pain immediately resolved suggesting accurate placement of the medication.  Advised to call if fevers/chills, erythema, induration, drainage, or persistent bleeding.  Images permanently stored and available for review in the ultrasound unit.  Impression: Technically successful ultrasound guided injection.

## 2014-11-11 NOTE — Assessment & Plan Note (Addendum)
Orthovisc injection #3 of 4 into both knees.  Pain relief is currently at 80%. Return in one week for #4.

## 2014-11-17 ENCOUNTER — Ambulatory Visit: Payer: 59 | Admitting: Sports Medicine

## 2014-11-22 ENCOUNTER — Encounter: Payer: Self-pay | Admitting: Sports Medicine

## 2014-11-22 ENCOUNTER — Ambulatory Visit (INDEPENDENT_AMBULATORY_CARE_PROVIDER_SITE_OTHER): Payer: 59 | Admitting: Sports Medicine

## 2014-11-22 DIAGNOSIS — M170 Bilateral primary osteoarthritis of knee: Secondary | ICD-10-CM | POA: Diagnosis not present

## 2014-11-22 MED ORDER — DICLOFENAC SODIUM 75 MG PO TBEC: 75.0000 mg | DELAYED_RELEASE_TABLET | Freq: Two times a day (BID) | ORAL | Status: DC

## 2014-11-22 NOTE — Assessment & Plan Note (Addendum)
Orthovisc injection #4 of 4 into both knees, starting diclofenac. Next line return in one month, arthroscopy if no better.

## 2014-11-22 NOTE — Progress Notes (Signed)
  Procedure: Real-time Ultrasound Guided Injection of left knee Device: GE Logiq E  Verbal informed consent obtained.  Time-out conducted.  Noted no overlying erythema, induration, or other signs of local infection.  Skin prepped in a sterile fashion.  Local anesthesia: Topical Ethyl chloride.  With sterile technique and under real time ultrasound guidance:  30 mg/2 mL of OrthoVisc (sodium hyaluronate) in a prefilled syringe was injected easily into the knee through a 22-gauge needle. Completed without difficulty  Pain immediately resolved suggesting accurate placement of the medication.  Advised to call if fevers/chills, erythema, induration, drainage, or persistent bleeding.  Images permanently stored and available for review in the ultrasound unit.  Impression: Technically successful ultrasound guided injection.  Procedure: Real-time Ultrasound Guided Injection of right knee Device: GE Logiq E  Verbal informed consent obtained.  Time-out conducted.  Noted no overlying erythema, induration, or other signs of local infection.  Skin prepped in a sterile fashion.  Local anesthesia: Topical Ethyl chloride.  With sterile technique and under real time ultrasound guidance:  Aspirated 16mL of straw-colored fluid, syringe switched and  30 mg/2 mL of OrthoVisc (sodium hyaluronate) in a prefilled syringe was injected easily into the knee through a 22-gauge needle. Completed without difficulty  Pain immediately resolved suggesting accurate placement of the medication.  Advised to call if fevers/chills, erythema, induration, drainage, or persistent bleeding.  Images permanently stored and available for review in the ultrasound unit.  Impression: Technically successful ultrasound guided injection.

## 2014-12-20 ENCOUNTER — Ambulatory Visit: Payer: 59 | Admitting: Sports Medicine

## 2014-12-21 ENCOUNTER — Encounter: Payer: Self-pay | Admitting: Sports Medicine

## 2014-12-21 ENCOUNTER — Ambulatory Visit (INDEPENDENT_AMBULATORY_CARE_PROVIDER_SITE_OTHER): Payer: 59 | Admitting: Sports Medicine

## 2014-12-21 VITALS — BP 132/84 | HR 101 | Ht 65.0 in | Wt 217.0 lb

## 2014-12-21 DIAGNOSIS — M170 Bilateral primary osteoarthritis of knee: Secondary | ICD-10-CM

## 2014-12-21 NOTE — Progress Notes (Signed)
  Subjective:    CC: Follow-up  HPI: This is a pleasant 49 year old female who returns for follow-up of bilateral primary knee osteoarthritis, we have done a multitude of conservative measures including physical therapy, NSAIDs, steroids injections, more recently we finished another course of Visco supplementation, Orthovisc. Unfortunately she continues to have effusions and pain, that are at the point where they're intolerable. Symptoms are moderate, persistent without radiation.  Past medical history, Surgical history, Family history not pertinant except as noted below, Social history, Allergies, and medications have been entered into the medical record, reviewed, and no changes needed.   Review of Systems: No fevers, chills, night sweats, weight loss, chest pain, or shortness of breath.   Objective:    General: Well Developed, well nourished, and in no acute distress.  Neuro: Alert and oriented x3, extra-ocular muscles intact, sensation grossly intact.  HEENT: Normocephalic, atraumatic, pupils equal round reactive to light, neck supple, no masses, no lymphadenopathy, thyroid nonpalpable.  Skin: Warm and dry, no rashes. Cardiac: Regular rate and rhythm, no murmurs rubs or gallops, no lower extremity edema.  Respiratory: Clear to auscultation bilaterally. Not using accessory muscles, speaking in full sentences. Knees: Visibly swollen with a palpable effusion of weight as well as tenderness at the joint lines as well as patellar crepitus.  Procedure: Real-time Ultrasound Guided aspiration of left knee Device: GE Logiq E  Verbal informed consent obtained.  Time-out conducted.  Noted no overlying erythema, induration, or other signs of local infection.  Skin prepped in a sterile fashion.  Local anesthesia: Topical Ethyl chloride.  With sterile technique and under real time ultrasound guidance:   18-gauge needle advanced into the suprapatellar recess, aspirated 31 mL of straw-colored  fluid Completed without difficulty  Pain immediately resolved suggesting accurate placement of the medication.  Advised to call if fevers/chills, erythema, induration, drainage, or persistent bleeding.  Images permanently stored and available for review in the ultrasound unit.  Impression: Technically successful ultrasound guided injection.  Procedure: Real-time Ultrasound Guided aspiration of right knee Device: GE Logiq E  Verbal informed consent obtained.  Time-out conducted.  Noted no overlying erythema, induration, or other signs of local infection.  Skin prepped in a sterile fashion.  Local anesthesia: Topical Ethyl chloride.  With sterile technique and under real time ultrasound guidance:  18-gauge advanced into the suprapatellar recess, aspirated a total of 47 mL straw-colored fluid. Completed without difficulty  Pain immediately resolved suggesting accurate placement of the medication.  Advised to call if fevers/chills, erythema, induration, drainage, or persistent bleeding.  Images permanently stored and available for review in the ultrasound unit.  Impression: Technically successful ultrasound guided injection.  Impression and Recommendations:   I spent 25 minutes with this patient, greater than 50% was face-to-face time counseling regarding the above diagnoses

## 2014-12-21 NOTE — Assessment & Plan Note (Signed)
Bilateral aspiration as above, unfortunately having persistent pain, effusions despite Orthovisc, at this point she has become a candidate for a knee arthroscopy, right knee is significantly worse than the left.

## 2014-12-30 ENCOUNTER — Encounter: Payer: Self-pay | Admitting: Sports Medicine

## 2014-12-30 ENCOUNTER — Ambulatory Visit (INDEPENDENT_AMBULATORY_CARE_PROVIDER_SITE_OTHER): Payer: 59 | Admitting: Sports Medicine

## 2014-12-30 DIAGNOSIS — M170 Bilateral primary osteoarthritis of knee: Secondary | ICD-10-CM

## 2014-12-30 MED ORDER — HYDROCODONE-IBUPROFEN 5-200 MG PO TABS: 1.0000 | ORAL_TABLET | Freq: Three times a day (TID) | ORAL | Status: DC | PRN

## 2014-12-30 NOTE — Assessment & Plan Note (Signed)
Unfortunately with persistent pain and swelling despite steroids injections and viscous supplementation. Bilateral aspiration today, she does have an appointment with orthopedic surgery for consideration of knee arthroscopy. FMLA as well as disability paperwork filled out today. I am going to add a bit of Vicoprofen for pain.

## 2014-12-30 NOTE — Progress Notes (Signed)
  Subjective:    CC: follow-up  HPI: Osteoarthritis of both knees: Failed Orthovisc, set up to see the surgeon, still having some swelling and pain today. She also has a great deal of forms that she needs filled out.  Past medical history, Surgical history, Family history not pertinant except as noted below, Social history, Allergies, and medications have been entered into the medical record, reviewed, and no changes needed.   Review of Systems: No fevers, chills, night sweats, weight loss, chest pain, or shortness of breath.   Objective:    General: Well Developed, well nourished, and in no acute distress.  Neuro: Alert and oriented x3, extra-ocular muscles intact, sensation grossly intact.  HEENT: Normocephalic, atraumatic, pupils equal round reactive to light, neck supple, no masses, no lymphadenopathy, thyroid nonpalpable.  Skin: Warm and dry, no rashes. Cardiac: Regular rate and rhythm, no murmurs rubs or gallops, no lower extremity edema.  Respiratory: Clear to auscultation bilaterally. Not using accessory muscles, speaking in full sentences.  Procedure: Real-time Ultrasound Guided aspiration of left knee Device: GE Logiq E  Verbal informed consent obtained.  Time-out conducted.  Noted no overlying erythema, induration, or other signs of local infection.  Skin prepped in a sterile fashion.  Local anesthesia: Topical Ethyl chloride.  With sterile technique and under real time ultrasound guidance:  Aspirated 40 mL straw-colored fluid Completed without difficulty  Pain immediately resolved suggesting accurate placement of the medication.  Advised to call if fevers/chills, erythema, induration, drainage, or persistent bleeding.  Images permanently stored and available for review in the ultrasound unit.  Impression: Technically successful ultrasound guided injection.  Procedure: Real-time Ultrasound Guided Injection of right knee Device: GE Logiq E  Verbal informed consent  obtained.  Time-out conducted.  Noted no overlying erythema, induration, or other signs of local infection.  Skin prepped in a sterile fashion.  Local anesthesia: Topical Ethyl chloride.  With sterile technique and under real time ultrasound guidance:  Aspirated 40 mL straw-colored fluid Completed without difficulty  Pain immediately resolved suggesting accurate placement of the medication.  Advised to call if fevers/chills, erythema, induration, drainage, or persistent bleeding.  Images permanently stored and available for review in the ultrasound unit.  Impression: Technically successful ultrasound guided injection.  Impression and Recommendations:

## 2015-03-01 ENCOUNTER — Emergency Department (HOSPITAL_BASED_OUTPATIENT_CLINIC_OR_DEPARTMENT_OTHER)
Admission: EM | Admit: 2015-03-01 | Discharge: 2015-03-01 | Disposition: A | Discharge status: Home / Self Care | Attending: Emergency Medicine | Admitting: Emergency Medicine

## 2015-03-01 ENCOUNTER — Encounter (HOSPITAL_BASED_OUTPATIENT_CLINIC_OR_DEPARTMENT_OTHER): Payer: Self-pay

## 2015-03-01 ENCOUNTER — Emergency Department (HOSPITAL_BASED_OUTPATIENT_CLINIC_OR_DEPARTMENT_OTHER)

## 2015-03-01 DIAGNOSIS — M25461 Effusion, right knee: Secondary | ICD-10-CM | POA: Diagnosis not present

## 2015-03-01 DIAGNOSIS — Y990 Civilian activity done for income or pay: Secondary | ICD-10-CM | POA: Diagnosis not present

## 2015-03-01 DIAGNOSIS — E079 Disorder of thyroid, unspecified: Secondary | ICD-10-CM | POA: Insufficient documentation

## 2015-03-01 DIAGNOSIS — S8991XA Unspecified injury of right lower leg, initial encounter: Secondary | ICD-10-CM | POA: Insufficient documentation

## 2015-03-01 DIAGNOSIS — Z720 Tobacco use: Secondary | ICD-10-CM | POA: Diagnosis not present

## 2015-03-01 DIAGNOSIS — Y9289 Other specified places as the place of occurrence of the external cause: Secondary | ICD-10-CM | POA: Diagnosis not present

## 2015-03-01 DIAGNOSIS — X58XXXA Exposure to other specified factors, initial encounter: Secondary | ICD-10-CM | POA: Insufficient documentation

## 2015-03-01 DIAGNOSIS — Y9389 Activity, other specified: Secondary | ICD-10-CM | POA: Insufficient documentation

## 2015-03-01 HISTORY — DX: Disorder of thyroid, unspecified: E07.9

## 2015-03-01 MED ORDER — NAPROXEN 500 MG PO TABS: 500.0000 mg | ORAL_TABLET | Freq: Two times a day (BID) | ORAL | Status: DC

## 2015-03-01 MED ORDER — HYDROCODONE-ACETAMINOPHEN 5-325 MG PO TABS: ORAL_TABLET | ORAL | Status: DC

## 2015-03-01 NOTE — Discharge Instructions (Signed)
Please read and follow all provided instructions.  Your diagnoses today include:  1. Knee injury, right, initial encounter   2. Knee effusion, right     Tests performed today include:  An x-ray of the affected area - shows possible small fracture under the kneecap and fluid in the knee joint  Vital signs. See below for your results today.   Medications prescribed:   Naproxen - anti-inflammatory pain medication  Do not exceed 500mg  naproxen every 12 hours, take with food  You have been prescribed an anti-inflammatory medication or NSAID. Take with food. Take smallest effective dose for the shortest duration needed for your pain. Stop taking if you experience stomach pain or vomiting.    Vicodin (hydrocodone/acetaminophen) - narcotic pain medication  DO NOT drive or perform any activities that require you to be awake and alert because this medicine can make you drowsy. BE VERY CAREFUL not to take multiple medicines containing Tylenol (also called acetaminophen). Doing so can lead to an overdose which can damage your liver and cause liver failure and possibly death.  Take any prescribed medications only as directed.  Home care instructions:   Follow any educational materials contained in this packet  Use crutches until cleared by your orthopedist.   Follow R.I.C.E. Protocol:  R - rest your injury   I  - use ice on injury without applying directly to skin  C - compress injury with bandage or splint  E - elevate the injury as much as possible   Follow-up instructions: Please follow-up with your orthopedic physician (bone specialist) in 5 days.   Return instructions:   Please return if your toes or feet are numb or tingling, appear gray or blue, or you have severe pain (also elevate the leg and loosen splint or wrap if you were given one)  Please return to the Emergency Department if you experience worsening symptoms.   Please return if you have any other emergent  concerns.  Additional Information:  Your vital signs today were: BP 166/109 mmHg   Pulse 102   Temp(Src) 98 F (36.7 C) (Oral)   Resp 16   Ht 5\' 5"  (1.651 m)   Wt 213 lb (96.616 kg)   BMI 35.44 kg/m2   SpO2 100% If your blood pressure (BP) was elevated above 135/85 this visit, please have this repeated by your doctor within one month. --------------

## 2015-03-01 NOTE — ED Notes (Signed)
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

## 2015-03-01 NOTE — ED Notes (Signed)
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

## 2015-03-01 NOTE — ED Provider Notes (Signed)
CSN: [id number]     Arrival date & time 03/01/15  1843 History  By signing my name below, I, Evon Slack, attest that this documentation has been prepared under the direction and in the presence of Renne Crigler, PA-C. Electronically Signed: Evon Slack, ED Scribe. 03/01/2015. 7:26 PM.      Chief Complaint  Patient presents with  . Knee Injury   The history is provided by the patient. No language interpreter was used.   HPI Comments: Colleen Kemp is a 49 y.o. female who presents to the Emergency Department complaining of new right knee injury onset today. 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 Pt presents with associated swelling. Pt states that the pain is worse with movement and bearing weight. Pt denies any treatments or medications PTA. Pt doesn't report any numbness or tingling. The onset of this condition was acute. The course is constant. Alleviating factors: none.    Past Medical History  Diagnosis Date  . Thyroid disease    Past Surgical History  Procedure Laterality Date  . Abdominal hysterectomy    . Breast lumpectomy     Family History  Problem Relation Age of Onset  . Hypertension Mother   . Diabetes Mother   . Hypertension Father    Social History  Substance Use Topics  . Smoking status: Light Tobacco Smoker  . Smokeless tobacco: None  . Alcohol Use: No   OB History    No data available      Review of Systems  Constitutional: Negative for activity change.  Musculoskeletal: Positive for joint swelling, arthralgias and gait problem. Negative for back pain and neck pain.  Skin: Negative for wound.  Neurological: Negative for weakness and numbness.     Allergies  Review of patient's allergies indicates no known allergies.  Home Medications   Prior to Admission medications   Medication Sig Start Date End Date Taking? Authorizing Provider  hydrochlorothiazide (HYDRODIURIL) 25 MG tablet  12/02/14   Historical Provider,  MD  hydrocodone-ibuprofen (VICOPROFEN) 5-200 MG per tablet Take 1 tablet by mouth every 8 (eight) hours as needed for pain. 12/30/14   Monica Becton, MD  levothyroxine (SYNTHROID, LEVOTHROID) 100 MCG tablet  12/28/14   Historical Provider, MD   BP 166/109 mmHg  Pulse 102  Temp(Src) 98 F (36.7 C) (Oral)  Resp 16  Ht  (1.651 m)  Wt 213 lb (96.616 kg)  BMI 35.44 kg/m2  SpO2 100%   Physical Exam  Constitutional: She appears well-developed and well-nourished.  HENT:  Head: Normocephalic and atraumatic.  Eyes: Pupils are equal, round, and reactive to light.  Neck: Normal range of motion. Neck supple.  Cardiovascular: Exam reveals no decreased pulses.   Pulses:      Dorsalis pedis pulses are 2+ on the right side, and 2+ on the left side.  Musculoskeletal: She exhibits tenderness. She exhibits no edema.       Right hip: Normal.       Right knee: She exhibits effusion. She exhibits normal range of motion. Tenderness found. Lateral joint line tenderness noted. No medial joint line tenderness noted.       Right ankle: Normal.       Lumbar back: Normal.       Right upper leg: Normal.       Right lower leg: Normal.       Right foot: Normal.  Neurological: She is alert. No sensory deficit.  Motor, sensation, and vascular  distal to the injury is fully intact.   Skin: Skin is warm and dry.  Psychiatric: She has a normal mood and affect.  Nursing note and vitals reviewed.   ED Course  Procedures (including critical care time) DIAGNOSTIC STUDIES: Oxygen Saturation is 100% on RA, normal by my interpretation.    COORDINATION OF CARE: 7:23 PM-Discussed treatment plan with pt at bedside and pt agreed to plan.   Labs Review Labs Reviewed - No data to display  Imaging Review Dg Knee Complete 4 Views Right  03/01/2015  CLINICAL DATA:  49 year old female with acute right knee pain following twisting injury today. Unable to bear weight. Initial encounter. EXAM: RIGHT KNEE -  COMPLETE 4+ VIEW COMPARISON:  None. FINDINGS: A tiny bony density along the anterior femoral condyles on the lateral view at the inferior aspect of the patellofemoral compartment noted - small fracture is not excluded. A moderate joint effusion is present. There is no evidence of subluxation or dislocation. Tricompartmental degenerative changes are noted, moderate in the medial compartment. No focal bony lesions are present. IMPRESSION: Equivocal small fracture along the anterior femoral condyle lateral view. Moderate knee effusion. Tricompartmental degenerative changes, moderate in the medial compartment. Electronically Signed   By: Harmon PierJeffrey  Hu M.D.   On: 03/01/2015 19:14      EKG Interpretation None       Patient seen and examined. Patient informed of x-ray results. Will discharge to home with pain medication and NSAIDs. Patient [place]oped[place] [facility] with whom she will follow-up.  Vital signs reviewed and are as follows: BP 142/87 mmHg  Pulse 84  Temp(Src) 98 F (36.7 C) (Oral)  Resp 20  Ht 5\' 5"  (1.651 m)  Wt 213 lb (96.616 kg)  BMI 35.44 kg/m2  SpO2 100%   Patient counseled on use of narcotic pain medications. Counseled not to combine these medications with others containing tylenol. Urged not to drink alcohol, drive, or perform any other activities that requires focus while taking these medications. The patient verbalizes understanding and agrees with the plan.    MDM   Final diagnoses:  Knee injury, right, initial encounter  Knee effusion, right   Patient with knee injury, probable ligamentous injury area and possible small fracture. Patient will need ortho follow-up. She is placed in a knee immobilizer and crutches. Pain control given. Lower extremity is neurovascularly intact.  I personally performed the services described in this documentation, which was scribed in my presence. The recorded information has been reviewed and is accurate.      Renne CriglerJoshua Elika Godar,  PA-C 03/01/15 2005  Arby BarretteMarcy Pfeiffer, MD 03/06/15 [phone]

## 2015-04-08 ENCOUNTER — Encounter (HOSPITAL_BASED_OUTPATIENT_CLINIC_OR_DEPARTMENT_OTHER): Payer: Self-pay | Admitting: Emergency Medicine

## 2015-04-08 ENCOUNTER — Emergency Department (HOSPITAL_BASED_OUTPATIENT_CLINIC_OR_DEPARTMENT_OTHER)
Admission: EM | Admit: 2015-04-08 | Discharge: 2015-04-08 | Disposition: A | Discharge status: Home / Self Care | Payer: 59 | Attending: Emergency Medicine | Admitting: Emergency Medicine

## 2015-04-08 ENCOUNTER — Emergency Department (HOSPITAL_BASED_OUTPATIENT_CLINIC_OR_DEPARTMENT_OTHER): Payer: 59

## 2015-04-08 DIAGNOSIS — R1013 Epigastric pain: Secondary | ICD-10-CM | POA: Diagnosis present

## 2015-04-08 DIAGNOSIS — N23 Unspecified renal colic: Secondary | ICD-10-CM | POA: Diagnosis not present

## 2015-04-08 DIAGNOSIS — Z90710 Acquired absence of both cervix and uterus: Secondary | ICD-10-CM | POA: Insufficient documentation

## 2015-04-08 DIAGNOSIS — F17200 Nicotine dependence, unspecified, uncomplicated: Secondary | ICD-10-CM | POA: Insufficient documentation

## 2015-04-08 DIAGNOSIS — Z791 Long term (current) use of non-steroidal anti-inflammatories (NSAID): Secondary | ICD-10-CM | POA: Insufficient documentation

## 2015-04-08 DIAGNOSIS — E079 Disorder of thyroid, unspecified: Secondary | ICD-10-CM | POA: Diagnosis not present

## 2015-04-08 LAB — CBC WITH DIFFERENTIAL/PLATELET
Basophils Absolute: 0 10*3/uL (ref 0.0–0.1)
Basophils Relative: 0 %
EOS ABS: 0.2 10*3/uL (ref 0.0–0.7)
EOS PCT: 2 %
HCT: 39 % (ref 36.0–46.0)
Hemoglobin: 13 g/dL (ref 12.0–15.0)
LYMPHS ABS: 2.4 10*3/uL (ref 0.7–4.0)
Lymphocytes Relative: 25 %
MCH: 27.8 pg (ref 26.0–34.0)
MCHC: 33.3 g/dL (ref 30.0–36.0)
MCV: 83.3 fL (ref 78.0–100.0)
Monocytes Absolute: 1.1 10*3/uL — ABNORMAL HIGH (ref 0.1–1.0)
Monocytes Relative: 11 %
Neutro Abs: 6.1 10*3/uL (ref 1.7–7.7)
Neutrophils Relative %: 62 %
PLATELETS: 292 10*3/uL (ref 150–400)
RBC: 4.68 MIL/uL (ref 3.87–5.11)
RDW: 13.4 % (ref 11.5–15.5)
WBC: 9.8 10*3/uL (ref 4.0–10.5)

## 2015-04-08 LAB — URINALYSIS, ROUTINE W REFLEX MICROSCOPIC
BILIRUBIN URINE: NEGATIVE
Glucose, UA: NEGATIVE mg/dL
Ketones, ur: NEGATIVE mg/dL
Nitrite: NEGATIVE
PROTEIN: 30 mg/dL — AB
Specific Gravity, Urine: 1.025 (ref 1.005–1.030)
pH: 6 (ref 5.0–8.0)

## 2015-04-08 LAB — URINE MICROSCOPIC-ADD ON

## 2015-04-08 LAB — COMPREHENSIVE METABOLIC PANEL
ALT: 23 U/L (ref 14–54)
ANION GAP: 8 (ref 5–15)
AST: 23 U/L (ref 15–41)
Albumin: 4.5 g/dL (ref 3.5–5.0)
Alkaline Phosphatase: 56 U/L (ref 38–126)
BUN: 22 mg/dL — ABNORMAL HIGH (ref 6–20)
CHLORIDE: 102 mmol/L (ref 101–111)
CO2: 26 mmol/L (ref 22–32)
CREATININE: 1.28 mg/dL — AB (ref 0.44–1.00)
Calcium: 9.6 mg/dL (ref 8.9–10.3)
GFR, EST AFRICAN AMERICAN: 57 mL/min — AB (ref >60)
GFR, EST NON AFRICAN AMERICAN: 49 mL/min — AB (ref >60)
Glucose, Bld: 140 mg/dL — ABNORMAL HIGH (ref 65–99)
Potassium: 2.9 mmol/L — ABNORMAL LOW (ref 3.5–5.1)
Sodium: 136 mmol/L (ref 135–145)
Total Bilirubin: 0.6 mg/dL (ref 0.3–1.2)
Total Protein: 8.3 g/dL — ABNORMAL HIGH (ref 6.5–8.1)

## 2015-04-08 LAB — LIPASE, BLOOD: LIPASE: 21 U/L (ref 11–51)

## 2015-04-08 MED ORDER — TAMSULOSIN HCL 0.4 MG PO CAPS: 0.4000 mg | ORAL_CAPSULE | Freq: Every day | ORAL | Status: DC

## 2015-04-08 MED ORDER — HYDROMORPHONE HCL 1 MG/ML IJ SOLN
1.0000 mg | Freq: Once | INTRAMUSCULAR | Status: AC
Start: 2015-04-08 — End: 2015-04-08
  Administered 2015-04-08: 1 mg via INTRAVENOUS
  Filled 2015-04-08: qty 1

## 2015-04-08 MED ORDER — OXYCODONE-ACETAMINOPHEN 5-325 MG PO TABS: 1.0000 | ORAL_TABLET | ORAL | Status: DC | PRN

## 2015-04-08 MED ORDER — ONDANSETRON HCL 4 MG PO TABS: 4.0000 mg | ORAL_TABLET | Freq: Four times a day (QID) | ORAL | Status: DC

## 2015-04-08 MED ORDER — IOHEXOL 300 MG/ML  SOLN
50.0000 mL | Freq: Once | INTRAMUSCULAR | Status: AC | PRN
  Administered 2015-04-08: 50 mL via ORAL

## 2015-04-08 MED ORDER — SODIUM CHLORIDE 0.9 % IV BOLUS (SEPSIS)
500.0000 mL | Freq: Once | INTRAVENOUS | Status: AC
  Administered 2015-04-08: 500 mL via INTRAVENOUS

## 2015-04-08 MED ORDER — IOHEXOL 300 MG/ML  SOLN
50.0000 mL | Freq: Once | INTRAMUSCULAR | Status: DC | PRN
  Administered 2015-04-08: 50 mL via INTRAVENOUS

## 2015-04-08 MED ORDER — ONDANSETRON HCL 4 MG/2ML IJ SOLN
4.0000 mg | Freq: Once | INTRAMUSCULAR | Status: AC
  Administered 2015-04-08: 4 mg via INTRAVENOUS
  Filled 2015-04-08: qty 2

## 2015-04-08 MED ORDER — MORPHINE SULFATE (PF) 4 MG/ML IV SOLN
4.0000 mg | Freq: Once | INTRAVENOUS | Status: AC
  Administered 2015-04-08: 4 mg via INTRAVENOUS
  Filled 2015-04-08: qty 1

## 2015-04-08 NOTE — ED Notes (Signed)
Pt states pain started at 8pm epigastric going into back on left side.

## 2015-04-08 NOTE — Discharge Instructions (Signed)
If you develop a fever or your pain is uncontrolled, [facility]  Kidney Stones Kidney stones (urolithiasis) are deposits that form inside your kidneys. The intense pain is caused by the stone moving through the urinary tract. When the stone moves, the ureter goes into spasm around the stone. The stone is usually passed in the urine.  CAUSES   A disorder that makes certain neck glands produce too much parathyroid hormone (primary hyperparathyroidism).  A buildup of uric acid crystals, similar to gout in your joints.  Narrowing (stricture) of the ureter.  A kidney obstruction present at birth (congenital obstruction).  Previous surgery on the kidney or ureters.  Numerous kidney infections. SYMPTOMS   Feeling sick to your stomach (nauseous).  Throwing up (vomiting).  Blood in the urine (hematuria).  Pain that usually spreads (radiates) to the groin.  Frequency or urgency of urination. DIAGNOSIS   Taking a history and physical exam.  Blood or urine tests.  CT scan.  Occasionally, an examination of the inside of the urinary bladder (cystoscopy) is performed. TREATMENT   Observation.  Increasing your fluid intake.  Extracorporeal shock wave lithotripsy--This is a noninvasive procedure that uses shock waves to break up kidney stones.  Surgery may be needed if you have severe pain or persistent obstruction. There are various surgical procedures. Most of the procedures are performed with the use of small instruments. Only small incisions are needed to accommodate these instruments, so recovery time is minimized. The size, location, and chemical composition are all important variables that will determine the proper choice of action for you. Talk to your health care provider to better understand your situation so that you will minimize the risk of injury to yourself and your kidney.  HOME CARE INSTRUCTIONS   Drink enough water and fluids to keep your  urine clear or pale yellow. This will help you to pass the stone or stone fragments.  Strain all urine through the provided strainer. Keep all particulate matter and stones for your health care provider to see. The stone causing the pain may be as small as a grain of salt. It is very important to use the strainer each and every time you pass your urine. The collection of your stone will allow your health care provider to analyze it and verify that a stone has actually passed. The stone analysis will often identify what you can do to reduce the incidence of recurrences.  Only take over-the-counter or prescription medicines for pain, discomfort, or fever as directed by your health care provider.  Keep all follow-up visits as told by your health care provider. This is important.  Get follow-up X-rays if required. The absence of pain does not always mean that the stone has passed. It may have only stopped moving. If the urine remains completely obstructed, it can cause loss of kidney function or even complete destruction of the kidney. It is your responsibility to make sure X-rays and follow-ups are completed. Ultrasounds of the kidney can show blockages and the status of the kidney. Ultrasounds are not associated with any radiation and can be performed easily in a matter of minutes.  Make changes to your daily diet as told by your health care provider. You may be told to:  Limit the amount of salt that you eat.  Eat 5 or more servings of fruits and vegetables each day.  Limit the amount of meat, poultry, fish, and eggs that you eat.  Collect a 24-hour urine  sample as told by your health care provider.You may need to collect another urine sample every 6-12 months. SEEK MEDICAL CARE IF:  You experience pain that is progressive and unresponsive to any pain medicine you have been prescribed. SEEK IMMEDIATE MEDICAL CARE IF:   Pain cannot be controlled with the prescribed medicine.  You have a  fever or shaking chills.  The severity or intensity of pain increases over 18 hours and is not relieved by pain medicine.  You develop a new onset of abdominal pain.  You feel faint or pass out.  You are unable to urinate.   This information is not intended to replace advice given to you by your health care provider. Make sure you discuss any questions you have with your health care provider.   Document Released: 04/22/2005 Document Revised: 01/11/2015 Document Reviewed: 09/23/2012 Elsevier Interactive Patient Education [employer].

## 2015-04-08 NOTE — ED Provider Notes (Signed)
CSN: [id number]     Arrival date & time 04/08/15  0051 History   First MD Initiated Contact with Patient 04/08/15 0106     Chief Complaint  Patient presents with  . Abdominal Pain     (Consider location/radiation/quality/duration/timing/severity/associated sxs/prior Treatment) HPI Comments: Patient presents with complaints of abdominal pain. Patient reports epigastric pain that started at 8 PM tonight. She does not have any identified cause of the pain. Pain has been persistent, radiates around the left side to the back. She has had nausea but no vomiting. There is no diarrhea associated with symptoms. She is not spitting chest pain or shortness of breath. Pain has been persistent, moderately severe. She has never had similar pain before.  Patient is a 49 y.o. female presenting with abdominal pain.  Abdominal Pain Associated symptoms: nausea   Associated symptoms: no constipation, no diarrhea and no vomiting     Past Medical History  Diagnosis Date  . Thyroid disease    Past Surgical History  Procedure Laterality Date  . Abdominal hysterectomy    . Breast lumpectomy     Family History  Problem Relation Age of Onset  . Hypertension Mother   . Diabetes Mother   . Hypertension Father    Social History  Substance Use Topics  . Smoking status: Light Tobacco Smoker  . Smokeless tobacco: None  . Alcohol Use: No   OB History    No data available     Review of Systems  Gastrointestinal: Positive for nausea and abdominal pain. Negative for vomiting, diarrhea and constipation.  All other systems reviewed and are negative.     Allergies  Review of patient's allergies indicates no known allergies.  Home Medications   Prior to Admission medications   Medication Sig Start Date End Date Taking? Authorizing Provider  hydrochlorothiazide (HYDRODIURIL) 25 MG tablet  12/02/14   Historical Provider, MD  HYDROcodone-acetaminophen (NORCO/VICODIN) 5-325 MG tablet Take 1-2 tablets  every 6 hours as needed for severe pain 03/01/15   Renne CriglerJoshua Geiple, PA-C  levothyroxine (SYNTHROID, LEVOTHROID) 100 MCG tablet  12/28/14   Historical Provider, MD  naproxen (NAPROSYN) 500 MG tablet Take 1 tablet (500 mg total) by mouth 2 (two) times daily. 03/01/15   Renne CriglerJoshua Geiple, PA-C   BP 116/75 mmHg  Pulse 96  Temp(Src) 98.5 F (36.9 C) (Oral)  Resp 18  Wt 213 lb (96.616 kg)  SpO2 100% Physical Exam  Constitutional: She is oriented to person, place, and time. She appears well-developed and well-nourished. No distress.  HENT:  Head: Normocephalic and atraumatic.  Right Ear: Hearing normal.  Left Ear: Hearing normal.  Nose: Nose normal.  Mouth/Throat: Oropharynx is clear and moist and mucous membranes are normal.  Eyes: Conjunctivae and EOM are normal. Pupils are equal, round, and reactive to light.  Neck: Normal range of motion. Neck supple.  Cardiovascular: Regular rhythm, S1 normal and S2 normal.  Exam reveals no gallop and no friction rub.   No murmur heard. Pulmonary/Chest: Effort normal and breath sounds normal. No respiratory distress. She exhibits no tenderness.  Abdominal: Soft. Normal appearance and bowel sounds are normal. There is no hepatosplenomegaly. There is tenderness in the epigastric area and left upper quadrant. There is no rebound, no guarding, no tenderness at McBurney's point and negative Murphy's sign. No hernia.  Musculoskeletal: Normal range of motion.  Neurological: She is alert and oriented to person, place, and time. She has normal strength. No cranial nerve deficit or sensory deficit. Coordination normal. GCS eye  subscore is 4. GCS verbal subscore is 5. GCS motor subscore is 6.  Skin: Skin is warm, dry and intact. No rash noted. No cyanosis.  Psychiatric: She has a normal mood and affect. Her speech is normal and behavior is normal. Thought content normal.  Nursing note and vitals reviewed.   ED Course  Procedures (including critical care time) Labs  Review Labs Reviewed  CBC WITH DIFFERENTIAL/PLATELET - Abnormal; Notable for the following:    Monocytes Absolute 1.1 (*)    All other components within normal limits  COMPREHENSIVE METABOLIC PANEL - Abnormal; Notable for the following:    Potassium 2.9 (*)    Glucose, Bld 140 (*)    BUN 22 (*)    Creatinine, Ser 1.28 (*)    Total Protein 8.3 (*)    GFR calc non Af Amer 49 (*)    GFR calc Af Amer 57 (*)    All other components within normal limits  URINALYSIS, ROUTINE W REFLEX MICROSCOPIC (NOT AT [facility]) - Abnormal; Notable for the following:    APPearance CLOUDY (*)    Hgb urine dipstick LARGE (*)    Protein, ur 30 (*)    Leukocytes, UA SMALL (*)    All other components within normal limits  URINE MICROSCOPIC-ADD ON - Abnormal; Notable for the following:    Squamous Epithelial / LPF 0-5 (*)    Bacteria, UA FEW (*)    All other components within normal limits  LIPASE, BLOOD    Imaging Review Ct Abdomen Pelvis W Contrast  04/08/2015  CLINICAL DATA:  Epigastric pain radiating to LEFT back beginning at 8 p.m. vomiting. History of hysterectomy. Bilateral breast lumpectomies. EXAM: CT ABDOMEN AND PELVIS WITH CONTRAST TECHNIQUE: Multidetector CT imaging of the abdomen and pelvis was performed using the standard protocol following bolus administration of intravenous contrast. CONTRAST:  50mL OMNIPAQUE IOHEXOL 300 MG/ML SOLN, 50mL OMNIPAQUE IOHEXOL 300 MG/ML SOLN COMPARISON:  None. FINDINGS: LUNG BASES: Dependent atelectasis. Small area of atelectasis LEFT lower lobe. Visualized heart and pericardium are unremarkable. SOLID ORGANS: The liver, spleen, gallbladder, pancreas and adrenal glands are unremarkable. GASTROINTESTINAL TRACT: The stomach, small and large bowel are normal in course and caliber without inflammatory changes. Mild colonic diverticulosis. Normal appendix. KIDNEYS/ URINARY TRACT: The kidneys are orthotopic. Delayed nephrogram on the LEFT, with moderate hydronephrosis to the  proximal ureter where a 9 x 6 mm calculus is present. Residual 2 mm LEFT lower pole nephrolithiasis. Striated nephrogram on delayed phase. Too small to characterize hypodensities in LEFT kidney. Subcentimeter hypodensities upper pole of RIGHT kidney, too small to characterize ; associated punctate calcifications. Enhancing exophytic solid RIGHT lower pole 19 x 22 mm mass, axial 17/30. Urinary bladder is decompressed, unremarkable. PERITONEUM/RETROPERITONEUM: Aortoiliac vessels are normal in course and caliber. No lymphadenopathy by CT size criteria. Status post hysterectomy. No intraperitoneal free fluid nor free air. SOFT TISSUE/OSSEOUS STRUCTURES: Non-suspicious. Small fat containing umbilical hernia. Grade 1 L5-S1 anterolisthesis, facet arthropathy resulting in moderate to severe bilateral L5-S1 neural foraminal narrowing. IMPRESSION: 6 x 9 mm proximal LEFT ureteral calculus resulting in moderate obstructive uropathy. In addition, striated LEFT nephrogram concerning for superimposed pyelonephritis. Residual 2 mm LEFT lower pole nephrolithiasis. 19 x 22 mm solid RIGHT lower pole renal mass for which follow-up MRI renal mass protocol is recommended for further characterization. RIGHT upper pole probable renal scarring with punctate calcifications. Colonic diverticulosis. Electronically Signed   By: Awilda Metro M.D.   On: 04/08/2015 04:31   I have personally reviewed and  evaluated these images and lab results as part of my medical decision-making.   EKG Interpretation   Date/Time:  Saturday April 08 2015 01:23:46 EST Ventricular Rate:  92 PR Interval:  138 QRS Duration: 80 QT Interval:  362 QTC Calculation: 447 R Axis:   17 Text Interpretation:  Normal sinus rhythm Nonspecific ST and T wave  abnormality Abnormal ECG Confirmed by Makya Phillis  MD, Starling Christofferson ([id number]) on  04/08/2015 2:40:44 AM      MDM   Final diagnoses:  None   renal colic  Presents to the ER for evaluation of left-sided  abdominal pain. Pain started in the epigastric region and radiated around the left upper abdomen to the back area. She had nausea without vomiting. Abdominal examination, however, was benign. Workup revealed hematuria, no other abnormalities on blood work. CT scan was performed to further evaluate. 6 x 9 proximal left ureteral calculus was identified. There is moderate obstructive uropathy. Additionally radiation concerning for possible superimposed pyelonephritis was commented on.  Case was discussed with Dr. Karleen Hampshire, on call for urology. Results include a CT results and urinalysis, blood work were reviewed. She did not feel that the current presentation supported diagnosis of infected stone, recommended analgesia and follow-up in the office. Go immediately to [facility] emergency department for increasing pain or fever. Will empirically treat with Bactrim.    Gilda Crease, MD 04/08/15 [phone]

## 2015-04-11 ENCOUNTER — Other Ambulatory Visit (HOSPITAL_COMMUNITY): Payer: Self-pay | Admitting: *Deleted

## 2015-04-11 ENCOUNTER — Encounter (HOSPITAL_COMMUNITY): Payer: Self-pay | Admitting: *Deleted

## 2015-04-11 ENCOUNTER — Other Ambulatory Visit: Payer: Self-pay | Admitting: Urology

## 2015-04-12 ENCOUNTER — Encounter (HOSPITAL_COMMUNITY): Payer: Self-pay | Admitting: *Deleted

## 2015-04-12 ENCOUNTER — Ambulatory Visit (HOSPITAL_COMMUNITY): Payer: 59 | Admitting: Certified Registered Nurse Anesthetist

## 2015-04-12 ENCOUNTER — Ambulatory Visit (HOSPITAL_COMMUNITY)
Admission: RE | Admit: 2015-04-12 | Discharge: 2015-04-12 | Disposition: A | Discharge status: Home / Self Care | Payer: 59 | Source: Ambulatory Visit | Attending: Urology | Admitting: Urology

## 2015-04-12 ENCOUNTER — Encounter (HOSPITAL_COMMUNITY)
Admission: RE | Disposition: A | Discharge status: Home / Self Care | Payer: Self-pay | Source: Ambulatory Visit | Attending: Urology

## 2015-04-12 DIAGNOSIS — E039 Hypothyroidism, unspecified: Secondary | ICD-10-CM | POA: Insufficient documentation

## 2015-04-12 DIAGNOSIS — K219 Gastro-esophageal reflux disease without esophagitis: Secondary | ICD-10-CM | POA: Diagnosis not present

## 2015-04-12 DIAGNOSIS — M170 Bilateral primary osteoarthritis of knee: Secondary | ICD-10-CM | POA: Diagnosis not present

## 2015-04-12 DIAGNOSIS — N132 Hydronephrosis with renal and ureteral calculous obstruction: Secondary | ICD-10-CM | POA: Insufficient documentation

## 2015-04-12 DIAGNOSIS — I10 Essential (primary) hypertension: Secondary | ICD-10-CM | POA: Diagnosis not present

## 2015-04-12 DIAGNOSIS — Z90710 Acquired absence of both cervix and uterus: Secondary | ICD-10-CM | POA: Insufficient documentation

## 2015-04-12 DIAGNOSIS — G4730 Sleep apnea, unspecified: Secondary | ICD-10-CM | POA: Insufficient documentation

## 2015-04-12 DIAGNOSIS — Z87891 Personal history of nicotine dependence: Secondary | ICD-10-CM | POA: Diagnosis not present

## 2015-04-12 HISTORY — DX: Unspecified osteoarthritis, unspecified site: M19.90

## 2015-04-12 HISTORY — DX: Gastro-esophageal reflux disease without esophagitis: K21.9

## 2015-04-12 HISTORY — DX: Sleep apnea, unspecified: G47.30

## 2015-04-12 HISTORY — DX: Essential (primary) hypertension: I10

## 2015-04-12 HISTORY — PX: CYSTOSCOPY WITH RETROGRADE PYELOGRAM, URETEROSCOPY AND STENT PLACEMENT: SHX5789

## 2015-04-12 HISTORY — PX: HOLMIUM LASER APPLICATION: SHX5852

## 2015-04-12 LAB — POTASSIUM: POTASSIUM: 3.5 mmol/L (ref 3.5–5.1)

## 2015-04-12 SURGERY — CYSTOURETEROSCOPY, WITH RETROGRADE PYELOGRAM AND STENT INSERTION
Anesthesia: General | Site: Ureter | Laterality: Left

## 2015-04-12 MED ORDER — GENTAMICIN SULFATE 40 MG/ML IJ SOLN
5.0000 mg/kg | INTRAVENOUS | Status: AC
  Administered 2015-04-12: 480 mg via INTRAVENOUS
  Filled 2015-04-12: qty 12

## 2015-04-12 MED ORDER — LIDOCAINE HCL (CARDIAC) 20 MG/ML IV SOLN
INTRAVENOUS | Status: AC
  Filled 2015-04-12: qty 5

## 2015-04-12 MED ORDER — MIDAZOLAM HCL 5 MG/5ML IJ SOLN
INTRAMUSCULAR | Status: DC | PRN
  Administered 2015-04-12 (×2): 1 mg via INTRAVENOUS

## 2015-04-12 MED ORDER — SODIUM CHLORIDE 0.9 % IR SOLN
Status: DC | PRN
Start: 2015-04-12 — End: 2015-04-12
  Administered 2015-04-12: 5000 mL

## 2015-04-12 MED ORDER — MIDAZOLAM HCL 2 MG/2ML IJ SOLN: 0.5000 mg | Freq: Once | INTRAMUSCULAR | Status: DC | PRN

## 2015-04-12 MED ORDER — SODIUM CHLORIDE 0.9 % IR SOLN
Status: DC | PRN
  Administered 2015-04-12: 4000 mL

## 2015-04-12 MED ORDER — IOHEXOL 300 MG/ML  SOLN
INTRAMUSCULAR | Status: DC | PRN
  Administered 2015-04-12: 22 mL

## 2015-04-12 MED ORDER — FENTANYL CITRATE (PF) 100 MCG/2ML IJ SOLN
25.0000 ug | INTRAMUSCULAR | Status: DC | PRN
Start: 2015-04-12 — End: 2015-04-12

## 2015-04-12 MED ORDER — ONDANSETRON HCL 4 MG/2ML IJ SOLN
INTRAMUSCULAR | Status: DC | PRN
  Administered 2015-04-12 (×2): 2 mg via INTRAVENOUS

## 2015-04-12 MED ORDER — OXYCODONE-ACETAMINOPHEN 5-325 MG PO TABS
1.0000 | ORAL_TABLET | Freq: Four times a day (QID) | ORAL | Status: DC | PRN
  Administered 2015-04-12: 1 via ORAL
  Filled 2015-04-12: qty 1

## 2015-04-12 MED ORDER — CEPHALEXIN 500 MG PO CAPS: 500.0000 mg | ORAL_CAPSULE | Freq: Two times a day (BID) | ORAL | Status: DC

## 2015-04-12 MED ORDER — MEPERIDINE HCL 50 MG/ML IJ SOLN: 6.2500 mg | INTRAMUSCULAR | Status: DC | PRN

## 2015-04-12 MED ORDER — PHENYLEPHRINE HCL 10 MG/ML IJ SOLN
INTRAMUSCULAR | Status: DC | PRN
  Administered 2015-04-12 (×2): 40 ug via INTRAVENOUS

## 2015-04-12 MED ORDER — DEXAMETHASONE SODIUM PHOSPHATE 10 MG/ML IJ SOLN
INTRAMUSCULAR | Status: DC | PRN
  Administered 2015-04-12: 10 mg via INTRAVENOUS

## 2015-04-12 MED ORDER — SODIUM CHLORIDE 0.9 % IJ SOLN
INTRAMUSCULAR | Status: AC
  Filled 2015-04-12: qty 10

## 2015-04-12 MED ORDER — PROMETHAZINE HCL 25 MG/ML IJ SOLN: 6.2500 mg | INTRAMUSCULAR | Status: DC | PRN

## 2015-04-12 MED ORDER — METOCLOPRAMIDE HCL 5 MG/ML IJ SOLN
INTRAMUSCULAR | Status: DC | PRN
  Administered 2015-04-12: 5 mg via INTRAVENOUS

## 2015-04-12 MED ORDER — OXYCODONE-ACETAMINOPHEN 5-325 MG PO TABS: 1.0000 | ORAL_TABLET | Freq: Four times a day (QID) | ORAL | Status: DC | PRN

## 2015-04-12 MED ORDER — MIDAZOLAM HCL 2 MG/2ML IJ SOLN
INTRAMUSCULAR | Status: AC
  Filled 2015-04-12: qty 2

## 2015-04-12 MED ORDER — DEXAMETHASONE SODIUM PHOSPHATE 10 MG/ML IJ SOLN
INTRAMUSCULAR | Status: AC
  Filled 2015-04-12: qty 1

## 2015-04-12 MED ORDER — FENTANYL CITRATE (PF) 100 MCG/2ML IJ SOLN
INTRAMUSCULAR | Status: AC
  Filled 2015-04-12: qty 2

## 2015-04-12 MED ORDER — PROPOFOL 10 MG/ML IV BOLUS
INTRAVENOUS | Status: AC
  Filled 2015-04-12: qty 20

## 2015-04-12 MED ORDER — SENNOSIDES-DOCUSATE SODIUM 8.6-50 MG PO TABS: 1.0000 | ORAL_TABLET | Freq: Two times a day (BID) | ORAL | Status: DC

## 2015-04-12 MED ORDER — LIDOCAINE HCL (CARDIAC) 20 MG/ML IV SOLN
INTRAVENOUS | Status: DC | PRN
  Administered 2015-04-12: 75 mg via INTRAVENOUS

## 2015-04-12 MED ORDER — 0.9 % SODIUM CHLORIDE (POUR BTL) OPTIME
TOPICAL | Status: DC | PRN
Start: 2015-04-12 — End: 2015-04-12

## 2015-04-12 MED ORDER — LACTATED RINGERS IV SOLN
INTRAVENOUS | Status: DC | PRN
  Administered 2015-04-12 (×2): via INTRAVENOUS

## 2015-04-12 MED ORDER — ONDANSETRON HCL 4 MG/2ML IJ SOLN
INTRAMUSCULAR | Status: AC
  Filled 2015-04-12: qty 2

## 2015-04-12 MED ORDER — FENTANYL CITRATE (PF) 100 MCG/2ML IJ SOLN
INTRAMUSCULAR | Status: DC | PRN
  Administered 2015-04-12 (×2): 50 ug via INTRAVENOUS

## 2015-04-12 MED ORDER — PROPOFOL 10 MG/ML IV BOLUS
INTRAVENOUS | Status: DC | PRN
  Administered 2015-04-12: 175 mg via INTRAVENOUS

## 2015-04-12 SURGICAL SUPPLY — 29 items
BASKET DAKOTA 1.9FR 11X120 (BASKET) ×2 IMPLANT
BASKET LASER NITINOL 1.9FR (BASKET) IMPLANT
BASKET STNLS GEMINI 4WIRE 3FR (BASKET) IMPLANT
BASKET ZERO TIP NITINOL 2.4FR (BASKET) IMPLANT
BSKT STON RTRVL 120 1.9FR (BASKET)
BSKT STON RTRVL GEM 120X11 3FR (BASKET)
BSKT STON RTRVL ZERO TP 2.4FR (BASKET)
CATH INTERMIT  6FR 70CM (CATHETERS) ×3 IMPLANT
CLOTH BEACON ORANGE TIMEOUT ST (SAFETY) ×3 IMPLANT
ELECT REM PT RETURN 9FT ADLT (ELECTROSURGICAL)
ELECTRODE REM PT RTRN 9FT ADLT (ELECTROSURGICAL) IMPLANT
FIBER LASER FLEXIVA 1000 (UROLOGICAL SUPPLIES) IMPLANT
FIBER LASER FLEXIVA 200 (UROLOGICAL SUPPLIES) ×2 IMPLANT
FIBER LASER FLEXIVA 365 (UROLOGICAL SUPPLIES) IMPLANT
FIBER LASER FLEXIVA 550 (UROLOGICAL SUPPLIES) IMPLANT
FIBER LASER TRAC TIP (UROLOGICAL SUPPLIES) IMPLANT
GLOVE BIOGEL M STRL SZ7.5 (GLOVE) ×3 IMPLANT
GOWN STRL REUS W/TWL XL LVL3 (GOWN DISPOSABLE) ×8 IMPLANT
GUIDEWIRE ANG ZIPWIRE 038X150 (WIRE) ×3 IMPLANT
GUIDEWIRE STR DUAL SENSOR (WIRE) ×3 IMPLANT
IV NS 1000ML (IV SOLUTION) ×9
IV NS 1000ML BAXH (IV SOLUTION) IMPLANT
IV NS IRRIG 3000ML ARTHROMATIC (IV SOLUTION) ×3 IMPLANT
PACK CYSTO (CUSTOM PROCEDURE TRAY) ×3 IMPLANT
SHEATH ACCESS URETERAL 24CM (SHEATH) ×2 IMPLANT
STENT POLARIS 5FRX24 (STENTS) ×2 IMPLANT
SYRINGE 10CC LL (SYRINGE) ×2 IMPLANT
SYRINGE IRR TOOMEY STRL 70CC (SYRINGE) IMPLANT
TUBE FEEDING 8FR 16IN STR KANG (MISCELLANEOUS) ×3 IMPLANT

## 2015-04-12 NOTE — Discharge Instructions (Signed)
1 - You may have urinary urgency (bladder spasms) and bloody urine on / off with stent in place. This is normal. ° °2 - Call MD or go to ER for fever >102, severe pain / nausea / vomiting not relieved by medications, or acute change in medical status ° °

## 2015-04-12 NOTE — Anesthesia Postprocedure Evaluation (Signed)
Anesthesia Post Note  Patient: Kayson R Counterman  Procedure(s) Performed: Procedure(s) (LRB): CYSTOSCOPY WITH Left RETROGRADE PYELOGRAM, Left URETEROSCOPY , basket stone extraction AND Left Ureteral STENT PLACEMENT (Left) HOLMIUM LASER APPLICATION (Left)  Patient location during evaluation: PACU Anesthesia Type: General Level of consciousness: awake and alert, oriented and patient cooperative Pain management: pain level controlled Vital Signs Assessment: post-procedure vital signs reviewed and stable Respiratory status: spontaneous breathing, nonlabored ventilation and respiratory function stable Cardiovascular status: blood pressure returned to baseline and stable Postop Assessment: no signs of nausea or vomiting Anesthetic complications: no    Last Vitals:  Filed Vitals:   04/12/15 1835 04/12/15 1920  BP: 140/91 137/82  Pulse: 89 91  Temp: 36.4 C   Resp: 16 16    Last Pain:  Filed Vitals:   04/12/15 1921  PainSc: 0-No pain                 Caramia Boutin,E. Haylynn Pha

## 2015-04-12 NOTE — Anesthesia Preprocedure Evaluation (Addendum)
Anesthesia Evaluation  Patient identified by MRN, date of birth, ID band Patient awake    Reviewed: Allergy & Precautions, NPO status , Patient's Chart, lab work & pertinent test results  History of Anesthesia Complications Negative for: history of anesthetic complications  Airway Mallampati: I  TM Distance: >3 FB Neck ROM: Full    Dental  (+) Dental Advisory Given   Pulmonary sleep apnea (does not use CPAP yet) , former smoker,    breath sounds clear to auscultation       Cardiovascular hypertension, Pt. on medications (-) angina Rhythm:Regular Rate:Normal     Neuro/Psych negative neurological ROS     GI/Hepatic Neg liver ROS, GERD  Poorly Controlled,  Endo/Other  Hypothyroidism   Renal/GU negative Renal ROS     Musculoskeletal  (+) Arthritis , Osteoarthritis,    Abdominal (+) + obese,   Peds  Hematology negative hematology ROS (+)   Anesthesia Other Findings   Reproductive/Obstetrics                            Anesthesia Physical Anesthesia Plan  ASA: II  Anesthesia Plan: General   Post-op Pain Management:    Induction: Intravenous  Airway Management Planned: Oral ETT  Additional Equipment:   Intra-op Plan:   Post-operative Plan: Extubation in OR  Informed Consent: I have reviewed the patients History and Physical, chart, labs and discussed the procedure including the risks, benefits and alternatives for the proposed anesthesia with the patient or authorized representative who has indicated his/her understanding and acceptance.   Dental advisory given  Plan Discussed with: Surgeon and CRNA  Anesthesia Plan Comments: (Plan routine monitors, GETA)        Anesthesia Quick Evaluation

## 2015-04-12 NOTE — Transfer of Care (Signed)
Immediate Anesthesia Transfer of Care Note  Patient: Colleen Kemp  Procedure(s) Performed: Procedure(s): CYSTOSCOPY WITH Left RETROGRADE PYELOGRAM, Left URETEROSCOPY , basket stone extraction AND Left Ureteral STENT PLACEMENT (Left) HOLMIUM LASER APPLICATION (Left)  Patient Location: PACU  Anesthesia Type:General  Level of Consciousness: awake, oriented, patient cooperative, lethargic and responds to stimulation  Airway & Oxygen Therapy: Patient Spontanous Breathing and Patient connected to face mask oxygen  Post-op Assessment: Report given to RN, Post -op Vital signs reviewed and stable and Patient moving all extremities  Post vital signs: Reviewed and stable  Last Vitals:  Filed Vitals:   04/12/15 1432 04/12/15 1754  BP: 150/89   Pulse: 93 100  Temp: 36.9 C 36.4 C  Resp: 16 14    Complications: No apparent anesthesia complications

## 2015-04-12 NOTE — Progress Notes (Signed)
Dr Manny reports pt does NOT need to void prior to discharge 

## 2015-04-12 NOTE — Anesthesia Procedure Notes (Signed)
Procedure Name: Intubation Date/Time: 04/12/2015 4:36 PM Performed by: Edison PaceGRAY, Lelend Heinecke E Pre-anesthesia Checklist: Patient identified, Emergency Drugs available, Suction available, Patient being monitored and Timeout performed Patient Re-evaluated:Patient Re-evaluated prior to inductionOxygen Delivery Method: Circle system utilized Preoxygenation: Pre-oxygenation with 100% oxygen Intubation Type: IV induction and Cricoid Pressure applied Laryngoscope Size: Mac and 4 Grade View: Grade II Tube type: Oral Tube size: 7.5 mm Number of attempts: 1 Airway Equipment and Method: Stylet Placement Confirmation: ETT inserted through vocal cords under direct vision,  positive ETCO2 and breath sounds checked- equal and bilateral Secured at: 21 cm Tube secured with: Tape Dental Injury: Teeth and Oropharynx as per pre-operative assessment

## 2015-04-12 NOTE — Brief Op Note (Signed)
04/12/2015  5:38 PM  PATIENT:  Colleen Kemp  49 y.o. female  PRE-OPERATIVE DIAGNOSIS:  LEFT URETERAL STONES  POST-OPERATIVE DIAGNOSIS:  LEFT URETERAL STONES  PROCEDURE:  Procedure(s): CYSTOSCOPY WITH Left RETROGRADE PYELOGRAM, Left URETEROSCOPY , basket stone extraction AND Left Ureteral STENT PLACEMENT (Left) HOLMIUM LASER APPLICATION (Left)  SURGEON:  Surgeon(s) and Role:    * Sebastian Acheheodore Reality Dejonge, MD - Primary  PHYSICIAN ASSISTANT:   ASSISTANTS: none   ANESTHESIA:   general  EBL:  Total I/O In: -  Out: 100 [Other:100]  BLOOD ADMINISTERED:none  DRAINS: none   LOCAL MEDICATIONS USED:  NONE  SPECIMEN:  Source of Specimen:  left ureteral stone fragments  DISPOSITION OF SPECIMEN:  [facility] for compositional analysis  COUNTS:  YES  TOURNIQUET:  * No tourniquets in log *  DICTATION: .Other Dictation: Dictation Number [id number]  PLAN OF CARE: Admit to inpatient   PATIENT DISPOSITION:  PACU - hemodynamically stable.   Delay start of Pharmacological VTE agent (>24hrs) due to surgical blood loss or risk of bleeding: yes

## 2015-04-12 NOTE — H&P (Signed)
Colleen Kemp is an 49 y.o. female.    Chief Complaint: Pre-Op Left Ureteroscopic Stone Manipulation  HPI:   1- Nephrolithiasis - left 7mm UPJ stone with mod hydro by ER CT 04/08/15 on eval left flank pain. Stone is 7mm, 1220HU, SSD 15cm, and solitary. Visible just lateral to L2-3 interspace on scout images.  PMH sig for bilateral lumpectomy, hyst (benign), HTN, Hypothyroid. [facility]  Today " Colleen Kemp" is seen to proceed with left ureteroscopic stone manipulation. No interval fevers.  Past Medical History  Diagnosis Date  . Thyroid disease   . Hypertension   . Sleep apnea     not gotten cpap yet  . GERD (gastroesophageal reflux disease)   . Arthritis     oa both knees    Past Surgical History  Procedure Laterality Date  . Abdominal hysterectomy      partial  . Breast lumpectomy      cyst  . Tubal ligation      Family History  Problem Relation Age of Onset  . Hypertension Mother   . Diabetes Mother   . Hypertension Father    Social History:  reports that she has quit smoking. She has never used smokeless tobacco. She reports that she does not drink alcohol or use illicit drugs.  Allergies:  Allergies  Allergen Reactions  . Tramadol Nausea And Vomiting    No prescriptions prior to admission    No results found for this or any previous visit (from the past 48 hour(s)). No results found.  Review of Systems  Constitutional: Negative.  Negative for fever and chills.  HENT: Negative.   Eyes: Negative.   Respiratory: Negative.   Cardiovascular: Negative.   Gastrointestinal: Positive for nausea.  Genitourinary: Positive for flank pain.  Musculoskeletal: Negative.   Skin: Negative.   Neurological: Negative.   Endo/Heme/Allergies: Negative.   Psychiatric/Behavioral: Negative.     There were no vitals taken for this visit. Physical Exam  Constitutional: She appears well-developed.  HENT:  Head: Normocephalic.  Eyes: Pupils are  equal, round, and reactive to light.  Neck: Normal range of motion.  Cardiovascular: Normal rate.   Respiratory: Effort normal.  GI: Soft.  Genitourinary:  Mild left CVAT  Musculoskeletal: Normal range of motion.  Neurological: She is alert.  Skin: Skin is warm.  Psychiatric: She has a normal mood and affect. Her behavior is normal. Judgment and thought content normal.     Assessment/Plan   1- Nephrolithiasis - fiarly large and dense left ureteral stone. Chance of passage with medical therapy <25%. URS would be first choice given density and body habitus though SWL possible. She wants to proceed with URS.   We rediscussed ureteroscopic stone manipulation with basketing and laser-lithotripsy in detail.  We rediscussed risks including bleeding, infection, damage to kidney / ureter  bladder, rarely loss of kidney. We rediscussed anesthetic risks and rare but serious surgical complications including DVT, PE, MI, and mortality. We specifically addressed that in 5-10% of cases a staged approach is required with stenting followed by re-attempt ureteroscopy if anatomy unfavorable.   Colleen Kemp 04/12/2015, 6:46 AM

## 2015-04-13 ENCOUNTER — Encounter (HOSPITAL_COMMUNITY): Payer: Self-pay | Admitting: Urology

## 2015-04-13 NOTE — Op Note (Signed)
NAMMarland Kitchen:  Colleen Kemp, Colleen Kemp             ACCOUNT NO.:  [id number]  MEDICAL RECORD NO.:  [id number]  LOCATION:  [facility]                         [facility]  PHYSICIAN:  Colleen Acheheodore Christiann Hagerty, MD     DATE OF BIRTH:  16-Jun-1965  DATE OF PROCEDURE:  04/12/2015                              OPERATIVE REPORT  DIAGNOSIS:  Large left proximal ureteral stone with hydronephrosis and refractory colic.  PROCEDURE: 1. Cystoscopy with left retrograde pyelogram interpretation. 2. Left ureteroscopy with laser lithotripsy. 3. Insertion of left ureteral stent 5 x 26 Polaris, no tether.  ESTIMATED BLOOD LOSS:  Nil.  COMPLICATIONS:  None.  SPECIMEN:  Left proximal ureteral stone fragment for compositional analysis.  FINDINGS: 1. Completely impacted left proximal ureteral stone. 2. Lithotripsy and basket extraction. 3. Complete resolution of all stone fragments larger than 1/3rd mm     following holmium laser lithotripsy and basket extraction. 4. Significant mucosal edema at the area of prior stone impaction.  It     was felt that an indwelling ureteral stent would be warranted to     allow for further decompression. 5. Placement of left ureteral stent, proximal and renal pelvis, distal     in urinary bladder.  INDICATION:  Colleen Kemp is a pleasant 49 year old lady who was found on workup of colicky flank pain with nausea and occasional emesis to have a large proximal left ureteral stone, significant hydronephrosis. The stone was solitary.  She denies any worrisome infectious parameters such as significant bacteriuria or fevers.  Options were discussed with management including medical therapy versus shockwave lithotripsy versus ureteroscopy and given the refractory nature of stones, the largest stone in its high-density, she wished to proceed with left ureteroscopic stone manipulation.  Informed consent was obtained and placed in medical record.  PROCEDURE IN DETAIL:  Patient being [employer]ona Kemp,  was verified. Procedure being left ureteroscopic stone manipulation confirmed. Procedure was carried out.  Time-out was performed.  Intravenous antibiotics were administered.  General anesthesia introduced.  Patient placed into a low lithotomy position.  Sterile field was created by prepping and draping the patient's vagina, introitus, and proximal thighs using iodine x3.  Next, cystourethroscopy was performed using a 23-French rigid cystoscope with 30-degree offset lens.  Inspection of urinary bladder revealed no diverticula, calcifications, papular lesions.  The left ureteral orifice was cannulated with 6-French end- hole catheter and left retrograde pyelogram was obtained.  Left retrograde pyelogram demonstrated a single left ureter with large filling defect in the upper pole consistent with known stone.  There was hydronephrosis above this.  Minimal contrast being able to go pass the stone suggesting likely impaction.  A 0.038 Zip wire was advanced at the level of the stone and multiple angulations were used; however the wire could not be navigated past the stone also signifying impaction.  The wire was left at the site.  Next, semi-rigid ureteroscopy was performed at the distal 2/3rd of left ureter alongside a separate Sensor working wire.  No mucosal abnormalities were found.  The area of impacted stone was not visualized via this technique.  A 12/14, 24 cm ureteral access sheath was placed using continuous fluoroscopic vision at the level of proximal ureter approximately  4 cm below the level of impacted stone and flexible digital ureteroscopy was performed in the proximal ureter using flexible single channel ureteroscope.  An 8-French feeding tube was in urinary bladder for pressure release.  Inspection of the proximal ureter revealed a large impacted stone is anticipated that appeared to be much too large for simple basketing.  As such, holmium laser energy applied to the  stone using settings of 0.2 joules and 20 hertz.  Using a dusting technique, approximately 50% of the stone was ablated.  Remainder of the stone was fragmented into pieces, approximately 1 mm in diameter at once, through and through ureteral continuity had been established.  The safety wire was advanced at the level of the upper pole.  Additional stone fragmentation was performed, these fragments appeared to be amenable to basketing as such Iroquois type basket was used to grasp these fragments sequentially and they removed in their entirety, set aside for compositional analysis.  Repeat systematic inspection of the kidney x2 revealed complete resolution of all stone fragments larger than 1/3rd mm.  No evidence renal perforation.  The sheath was removed under continuous ureteroscopic vision and only the site of prior stone impaction was noted to have significant edema.  No mucosal abnormality and it was felt that given the mucosal edema at the site of prior stone impaction that stenting would be warranted without a tether as such, a new 5 x 26 Polaris stent was placed using fluoroscopic guidance. Proximal and renal pelvis and distal in urinary bladder and the procedure was terminated.  The patient tolerated procedure well.  No immediate periprocedural complications.  The patient was taken to the postanesthesia care unit in stable condition.          ______________________________ Colleen Ache, MD     TM/MEDQ  D:  04/12/2015  T:  04/13/2015  Job:  [id number]

## 2016-06-07 ENCOUNTER — Encounter (HOSPITAL_BASED_OUTPATIENT_CLINIC_OR_DEPARTMENT_OTHER): Payer: Self-pay

## 2016-06-07 ENCOUNTER — Emergency Department (HOSPITAL_BASED_OUTPATIENT_CLINIC_OR_DEPARTMENT_OTHER)
Admission: EM | Admit: 2016-06-07 | Discharge: 2016-06-07 | Disposition: A | Discharge status: Home / Self Care | Attending: Emergency Medicine | Admitting: Emergency Medicine

## 2016-06-07 DIAGNOSIS — Z87891 Personal history of nicotine dependence: Secondary | ICD-10-CM | POA: Insufficient documentation

## 2016-06-07 DIAGNOSIS — I10 Essential (primary) hypertension: Secondary | ICD-10-CM | POA: Insufficient documentation

## 2016-06-07 DIAGNOSIS — X58XXXA Exposure to other specified factors, initial encounter: Secondary | ICD-10-CM | POA: Insufficient documentation

## 2016-06-07 DIAGNOSIS — S46911A Strain of unspecified muscle, fascia and tendon at shoulder and upper arm level, right arm, initial encounter: Secondary | ICD-10-CM | POA: Diagnosis not present

## 2016-06-07 DIAGNOSIS — Y929 Unspecified place or not applicable: Secondary | ICD-10-CM | POA: Insufficient documentation

## 2016-06-07 DIAGNOSIS — S4991XA Unspecified injury of right shoulder and upper arm, initial encounter: Secondary | ICD-10-CM | POA: Diagnosis present

## 2016-06-07 DIAGNOSIS — Y990 Civilian activity done for income or pay: Secondary | ICD-10-CM | POA: Diagnosis not present

## 2016-06-07 DIAGNOSIS — Y939 Activity, unspecified: Secondary | ICD-10-CM | POA: Diagnosis not present

## 2016-06-07 MED ORDER — HYDROCODONE-ACETAMINOPHEN 5-325 MG PO TABS: 1.0000 | ORAL_TABLET | ORAL | 0 refills | Status: DC | PRN

## 2016-06-07 NOTE — ED Triage Notes (Addendum)
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

## 2016-06-07 NOTE — ED Provider Notes (Signed)
[facility] Provider Note   CSN: [id number] Arrival date & time: 06/07/16  1926  By signing my name below, I, Talbert Nan, attest that this documentation has been prepared under the direction and in the presence of Rolan Bucco, MD. Electronically Signed: Talbert Nan, Scribe. 06/07/16. 8:05 PM.    History   Chief Complaint Chief Complaint  Patient presents with  . Shoulder Injury    HPI Colleen Kemp is a 51 y.o. female who presents to the Emergency Department complaining of moderate worsening right shoulder pain that began yesterday after sorting mail for several hours. She reports no numbness or weakness lower down in her right extremity. She reports no fall or injury to the extremity in the past.   The history is provided by the patient. No language interpreter was used.    Past Medical History:  Diagnosis Date  . Arthritis    oa both knees  . GERD (gastroesophageal reflux disease)   . Hypertension   . Sleep apnea    not gotten cpap yet  . Thyroid disease     Patient Active Problem List   Diagnosis Date Noted  . Osteoarthritis of both knees 12/20/2013    Past Surgical History:  Procedure Laterality Date  . ABDOMINAL HYSTERECTOMY     partial  . BREAST LUMPECTOMY     cyst  . CYSTOSCOPY WITH RETROGRADE PYELOGRAM, URETEROSCOPY AND STENT PLACEMENT Left 04/12/2015   Procedure: CYSTOSCOPY WITH Left RETROGRADE PYELOGRAM, Left URETEROSCOPY , basket stone extraction AND Left Ureteral STENT PLACEMENT;  Surgeon: Sebastian Ache, MD;  Location: [facility];  Service: Urology;  Laterality: Left;  . HOLMIUM LASER APPLICATION Left 04/12/2015   Procedure: HOLMIUM LASER APPLICATION;  Surgeon: Sebastian Ache, MD;  Location: [facility];  Service: Urology;  Laterality: Left;  . TUBAL LIGATION      OB History    No data available       Home Medications    Prior to Admission medications   Medication Sig Start Date End Date Taking? Authorizing Provider    HYDROcodone-acetaminophen (NORCO/VICODIN) 5-325 MG tablet Take 1-2 tablets by mouth every 4 (four) hours as needed. 06/07/16   Rolan Bucco, MD  ibuprofen (ADVIL,MOTRIN) 800 MG tablet Take 800 mg by mouth every 6 (six) hours as needed (pain).    Historical Provider, MD  levothyroxine (SYNTHROID, LEVOTHROID) 100 MCG tablet Take 100 mcg by mouth daily before breakfast.  12/28/14   Historical Provider, MD  senna-docusate (SENOKOT-S) 8.6-50 MG tablet Take 1 tablet by mouth 2 (two) times daily. While taking pain meds to prevent constipation 04/12/15   Sebastian Ache, MD  topiramate (TOPAMAX) 50 MG tablet Take 50 mg by mouth daily. 03/14/15   Historical Provider, MD    Family History Family History  Problem Relation Age of Onset  . Hypertension Mother   . Diabetes Mother   . Hypertension Father     Social History Social History  Substance Use Topics  . Smoking status: Former Games developer  . Smokeless tobacco: Never Used  . Alcohol use No     Allergies   Tramadol   Review of Systems Review of Systems  Constitutional: Negative for chills, diaphoresis, fatigue and fever.  HENT: Negative for congestion, rhinorrhea and sneezing.   Eyes: Negative.   Respiratory: Negative for cough, chest tightness and shortness of breath.   Cardiovascular: Negative for chest pain and leg swelling.  Gastrointestinal: Negative for abdominal pain, blood in stool, diarrhea, nausea and vomiting.  Genitourinary: Negative for difficulty  urinating, flank pain, frequency and hematuria.  Musculoskeletal: Positive for arthralgias and myalgias. Negative for back pain.  Skin: Negative for rash.  Neurological: Negative for dizziness, speech difficulty, weakness, numbness and headaches.     Physical Exam Updated Vital Signs BP (!) 168/102 (BP Location: Left Arm)   Pulse 96   Temp 98.1 F (36.7 C) (Oral)   Resp 18   Ht 5\' 5"  (1.651 m)   Wt 207 lb (93.9 kg)   SpO2 100%   BMI 34.45 kg/m   Physical Exam   Constitutional: She is oriented to person, place, and time. She appears well-developed and well-nourished.  HENT:  Head: Normocephalic and atraumatic.  Neck: Normal range of motion. Neck supple.  Cardiovascular: Normal rate.   Pulmonary/Chest: Effort normal.  Musculoskeletal: She exhibits tenderness.  Pain to anterior right shoulder. Pain on external rotation and abduction. No pain to elbow or wrist. No swelling, warmth, or erythema. Normal sensation and motor function distal to injury. Radial pulses intact.  Neurological: She is alert and oriented to person, place, and time.  Skin: Skin is warm and dry.  Psychiatric: She has a normal mood and affect.     ED Treatments / Results   DIAGNOSTIC STUDIES: Oxygen Saturation is 100% on room air, normal by my interpretation.    COORDINATION OF CARE: 8:01 PM Discussed treatment plan with pt at bedside and pt agreed to plan.   Labs (all labs ordered are listed, but only abnormal results are displayed) Labs Reviewed - No data to display  EKG  EKG Interpretation None       Radiology No results found.  Procedures Procedures (including critical care time)  Medications Ordered in ED Medications - No data to display   Initial Impression / Assessment and Plan / ED Course  I have reviewed the triage vital signs and the nursing notes.  Pertinent labs & imaging results that were available during my care of the patient were reviewed by me and considered in my medical decision making (see chart for details).     Patient is placed in a shoulder sling. She was advised in ice and elevation. She was encouraged to use ibuprofen and was given this prescription for Vicodin. She was given referral to follow with Dr. Pearletha ForgeHudnall if her symptoms are not improving. Her blood pressure is elevated. This will need to be rechecked by her PCP. Patient was advised of this.  Final Clinical Impressions(s) / ED Diagnoses   Final diagnoses:  Shoulder  strain, right, initial encounter    New Prescriptions New Prescriptions   HYDROCODONE-ACETAMINOPHEN (NORCO/VICODIN) 5-325 MG TABLET    Take 1-2 tablets by mouth every 4 (four) hours as needed.   I personally performed the services described in this documentation, which was scribed in my presence.  The recorded information has been reviewed and considered.     Rolan BuccoMelanie Joshwa Hemric, MD 06/07/16 2014

## 2017-09-04 ENCOUNTER — Ambulatory Visit (INDEPENDENT_AMBULATORY_CARE_PROVIDER_SITE_OTHER): Payer: POS | Admitting: Orthopedic Surgery

## 2017-09-04 ENCOUNTER — Encounter (INDEPENDENT_AMBULATORY_CARE_PROVIDER_SITE_OTHER): Payer: Self-pay | Admitting: Orthopedic Surgery

## 2017-09-04 ENCOUNTER — Ambulatory Visit (INDEPENDENT_AMBULATORY_CARE_PROVIDER_SITE_OTHER): Payer: POS

## 2017-09-04 DIAGNOSIS — M1712 Unilateral primary osteoarthritis, left knee: Secondary | ICD-10-CM

## 2017-09-04 DIAGNOSIS — M1711 Unilateral primary osteoarthritis, right knee: Secondary | ICD-10-CM | POA: Diagnosis not present

## 2017-09-04 DIAGNOSIS — G8929 Other chronic pain: Secondary | ICD-10-CM | POA: Diagnosis not present

## 2017-09-04 DIAGNOSIS — M25561 Pain in right knee: Secondary | ICD-10-CM

## 2017-09-04 DIAGNOSIS — M25562 Pain in left knee: Secondary | ICD-10-CM | POA: Diagnosis not present

## 2017-09-06 NOTE — Progress Notes (Signed)
Office Visit Note   Patient: Colleen Kemp           Date of Birth: Feb 06, 1966           MRN: [id number] Visit Date: 09/04/2017 Requested by: Valinda Hoar, PA-C [address] [place], [place] [address] PCP: Eunice Blase, PA-C  Subjective: Chief Complaint  Patient presents with  . Left Knee - Pain  . Right Knee - Pain    HPI: Develop presents for evaluation of bilateral knee pain right worse than left.  She is had pain for several years.  Denies any history of injury or surgery.  States that the pain is constant and is worse with walking and activity.  She reports weakness giving way and popping.  Does not wake her from sleep at night.  She has had gel injection and cortisone injection in both knees without much relief done in [employer].  She works at the [employer].  She works as a Doctor, hospital.  She does report difficulty with prolonged standing.              ROS: All systems reviewed are negative as they relate to the chief complaint within the history of present illness.  Patient denies  fevers or chills.   Assessment & Plan: Visit Diagnoses:  1. Chronic pain of both knees   2. Unilateral primary osteoarthritis, left knee   3. Unilateral primary osteoarthritis, right knee     Plan: Impression is end-stage knee arthritis worse in the medial compartment bilaterally.  She is really failed all measures of conservative treatment but we can try a medial unloader brace on the more symptomatic right knee for now.  If that helps we will try to get one for the left knee.  Leg strengthening exercises and maintaining ideal body mass index encouraged.  I will see her back as needed.  Follow-Up Instructions: No follow-ups on file.   Orders:  Orders Placed This Encounter  Procedures  . XR KNEE 3 VIEW LEFT  . XR KNEE 3 VIEW RIGHT   No orders of the defined types were placed in this encounter.     Procedures: No procedures performed   Clinical Data: No additional  findings.  Objective: Vital Signs: There were no vitals taken for this visit.  Physical Exam:   Constitutional: Patient appears well-developed HEENT:  Head: Normocephalic Eyes:EOM are normal Neck: Normal range of motion Cardiovascular: Normal rate Pulmonary/chest: Effort normal Neurologic: Patient is alert Skin: Skin is warm Psychiatric: Patient has normal mood and affect    Ortho Exam: Orthopedic exam demonstrates slight varus alignment bilateral lower extremities.  Pedal pulses palpable.  Patient has pretty good range of motion in both knees with near full extension bilaterally and flexion easily past 90 degrees.  Medial greater than lateral joint line tenderness is present but with stable collateral cruciate ligaments.  ACL feels intact bilaterally.  Extensor mechanism intact and nontender.  Specialty Comments:  No specialty comments available.  Imaging: No results found.   PMFS History: Patient Active Problem List   Diagnosis Date Noted  . Osteoarthritis of both knees 12/20/2013   Past Medical History:  Diagnosis Date  . Arthritis    oa both knees  . GERD (gastroesophageal reflux disease)   . Hypertension   . Sleep apnea    not gotten cpap yet  . Thyroid disease     Family History  Problem Relation Age of Onset  . Hypertension Mother   .  Diabetes Mother   . Hypertension Father     Past Surgical History:  Procedure Laterality Date  . ABDOMINAL HYSTERECTOMY     partial  . BREAST LUMPECTOMY     cyst  . CYSTOSCOPY WITH RETROGRADE PYELOGRAM, URETEROSCOPY AND STENT PLACEMENT Left 04/12/2015   Procedure: CYSTOSCOPY WITH Left RETROGRADE PYELOGRAM, Left URETEROSCOPY , basket stone extraction AND Left Ureteral STENT PLACEMENT;  Surgeon: Sebastian Ache, MD;  Location: [facility];  Service: Urology;  Laterality: Left;  . HOLMIUM LASER APPLICATION Left 04/12/2015   Procedure: HOLMIUM LASER APPLICATION;  Surgeon: Sebastian Ache, MD;  Location: [facility];  Service: Urology;   Laterality: Left;  . TUBAL LIGATION     Social History   Occupational History  . Not on file  Tobacco Use  . Smoking status: Former Games developer  . Smokeless tobacco: Never Used  Substance and Sexual Activity  . Alcohol use: No  . Drug use: No  . Sexual activity: Not on file

## 2018-05-15 ENCOUNTER — Other Ambulatory Visit: Payer: Self-pay | Admitting: Physician Assistant

## 2018-05-15 ENCOUNTER — Ambulatory Visit
Admission: RE | Admit: 2018-05-15 | Discharge: 2018-05-15 | Disposition: A | Discharge status: Home / Self Care | Payer: Federal, State, Local not specified - PPO | Source: Ambulatory Visit | Attending: Physician Assistant | Admitting: Physician Assistant

## 2018-05-15 DIAGNOSIS — M1712 Unilateral primary osteoarthritis, left knee: Secondary | ICD-10-CM | POA: Diagnosis not present

## 2018-05-15 DIAGNOSIS — M19012 Primary osteoarthritis, left shoulder: Secondary | ICD-10-CM | POA: Diagnosis not present

## 2018-05-15 DIAGNOSIS — R52 Pain, unspecified: Secondary | ICD-10-CM

## 2018-05-15 DIAGNOSIS — M1711 Unilateral primary osteoarthritis, right knee: Secondary | ICD-10-CM | POA: Diagnosis not present

## 2018-05-15 DIAGNOSIS — M1812 Unilateral primary osteoarthritis of first carpometacarpal joint, left hand: Secondary | ICD-10-CM | POA: Diagnosis not present

## 2018-05-15 DIAGNOSIS — M25571 Pain in right ankle and joints of right foot: Secondary | ICD-10-CM | POA: Diagnosis not present

## 2018-07-02 DIAGNOSIS — R739 Hyperglycemia, unspecified: Secondary | ICD-10-CM | POA: Diagnosis not present

## 2018-07-02 DIAGNOSIS — E785 Hyperlipidemia, unspecified: Secondary | ICD-10-CM | POA: Diagnosis not present

## 2018-07-02 DIAGNOSIS — G4730 Sleep apnea, unspecified: Secondary | ICD-10-CM | POA: Diagnosis not present

## 2018-07-02 DIAGNOSIS — E079 Disorder of thyroid, unspecified: Secondary | ICD-10-CM | POA: Diagnosis not present

## 2018-07-24 DIAGNOSIS — M722 Plantar fascial fibromatosis: Secondary | ICD-10-CM | POA: Diagnosis not present

## 2018-07-24 DIAGNOSIS — M7661 Achilles tendinitis, right leg: Secondary | ICD-10-CM | POA: Diagnosis not present

## 2018-07-24 DIAGNOSIS — M9261 Juvenile osteochondrosis of tarsus, right ankle: Secondary | ICD-10-CM | POA: Diagnosis not present

## 2018-08-03 DIAGNOSIS — G43909 Migraine, unspecified, not intractable, without status migrainosus: Secondary | ICD-10-CM | POA: Diagnosis not present

## 2018-08-03 DIAGNOSIS — M1710 Unilateral primary osteoarthritis, unspecified knee: Secondary | ICD-10-CM | POA: Diagnosis not present

## 2018-08-03 DIAGNOSIS — Z6833 Body mass index (BMI) 33.0-33.9, adult: Secondary | ICD-10-CM | POA: Diagnosis not present

## 2018-08-03 DIAGNOSIS — F411 Generalized anxiety disorder: Secondary | ICD-10-CM | POA: Diagnosis not present

## 2018-08-13 DIAGNOSIS — M7661 Achilles tendinitis, right leg: Secondary | ICD-10-CM | POA: Diagnosis not present

## 2018-08-13 DIAGNOSIS — M9261 Juvenile osteochondrosis of tarsus, right ankle: Secondary | ICD-10-CM | POA: Diagnosis not present

## 2018-08-13 DIAGNOSIS — M722 Plantar fascial fibromatosis: Secondary | ICD-10-CM | POA: Diagnosis not present

## 2018-08-20 DIAGNOSIS — Z6833 Body mass index (BMI) 33.0-33.9, adult: Secondary | ICD-10-CM | POA: Diagnosis not present

## 2018-08-20 DIAGNOSIS — F419 Anxiety disorder, unspecified: Secondary | ICD-10-CM | POA: Diagnosis not present

## 2018-08-20 DIAGNOSIS — M1710 Unilateral primary osteoarthritis, unspecified knee: Secondary | ICD-10-CM | POA: Diagnosis not present

## 2018-08-20 DIAGNOSIS — M549 Dorsalgia, unspecified: Secondary | ICD-10-CM | POA: Diagnosis not present

## 2018-09-03 DIAGNOSIS — E079 Disorder of thyroid, unspecified: Secondary | ICD-10-CM | POA: Diagnosis not present

## 2018-09-03 DIAGNOSIS — E785 Hyperlipidemia, unspecified: Secondary | ICD-10-CM | POA: Diagnosis not present

## 2018-09-03 DIAGNOSIS — G4730 Sleep apnea, unspecified: Secondary | ICD-10-CM | POA: Diagnosis not present

## 2018-09-03 DIAGNOSIS — F419 Anxiety disorder, unspecified: Secondary | ICD-10-CM | POA: Diagnosis not present

## 2018-09-18 DIAGNOSIS — M9261 Juvenile osteochondrosis of tarsus, right ankle: Secondary | ICD-10-CM | POA: Diagnosis not present

## 2018-09-18 DIAGNOSIS — M7661 Achilles tendinitis, right leg: Secondary | ICD-10-CM | POA: Diagnosis not present

## 2018-09-18 DIAGNOSIS — M722 Plantar fascial fibromatosis: Secondary | ICD-10-CM | POA: Diagnosis not present

## 2018-10-22 DIAGNOSIS — R739 Hyperglycemia, unspecified: Secondary | ICD-10-CM | POA: Diagnosis not present

## 2018-10-22 DIAGNOSIS — E785 Hyperlipidemia, unspecified: Secondary | ICD-10-CM | POA: Diagnosis not present

## 2018-10-22 DIAGNOSIS — F419 Anxiety disorder, unspecified: Secondary | ICD-10-CM | POA: Diagnosis not present

## 2018-10-22 DIAGNOSIS — Z79899 Other long term (current) drug therapy: Secondary | ICD-10-CM | POA: Diagnosis not present

## 2018-10-22 DIAGNOSIS — E079 Disorder of thyroid, unspecified: Secondary | ICD-10-CM | POA: Diagnosis not present

## 2018-10-30 ENCOUNTER — Emergency Department (HOSPITAL_BASED_OUTPATIENT_CLINIC_OR_DEPARTMENT_OTHER)
Admission: EM | Admit: 2018-10-30 | Discharge: 2018-10-30 | Disposition: A | Discharge status: Home / Self Care | Payer: Federal, State, Local not specified - PPO | Attending: Emergency Medicine | Admitting: Emergency Medicine

## 2018-10-30 ENCOUNTER — Other Ambulatory Visit: Payer: Self-pay

## 2018-10-30 ENCOUNTER — Emergency Department (HOSPITAL_BASED_OUTPATIENT_CLINIC_OR_DEPARTMENT_OTHER): Payer: Federal, State, Local not specified - PPO

## 2018-10-30 ENCOUNTER — Encounter (HOSPITAL_BASED_OUTPATIENT_CLINIC_OR_DEPARTMENT_OTHER): Payer: Self-pay | Admitting: Emergency Medicine

## 2018-10-30 DIAGNOSIS — R51 Headache: Secondary | ICD-10-CM | POA: Insufficient documentation

## 2018-10-30 DIAGNOSIS — U071 COVID-19: Secondary | ICD-10-CM | POA: Diagnosis not present

## 2018-10-30 DIAGNOSIS — I10 Essential (primary) hypertension: Secondary | ICD-10-CM | POA: Insufficient documentation

## 2018-10-30 DIAGNOSIS — Z79899 Other long term (current) drug therapy: Secondary | ICD-10-CM | POA: Diagnosis not present

## 2018-10-30 DIAGNOSIS — M7918 Myalgia, other site: Secondary | ICD-10-CM | POA: Insufficient documentation

## 2018-10-30 DIAGNOSIS — R509 Fever, unspecified: Secondary | ICD-10-CM | POA: Diagnosis not present

## 2018-10-30 DIAGNOSIS — Z87891 Personal history of nicotine dependence: Secondary | ICD-10-CM | POA: Diagnosis not present

## 2018-10-30 DIAGNOSIS — R05 Cough: Secondary | ICD-10-CM | POA: Diagnosis not present

## 2018-10-30 DIAGNOSIS — Z20822 Contact with and (suspected) exposure to covid-19: Secondary | ICD-10-CM

## 2018-10-30 NOTE — Discharge Instructions (Addendum)
Person Under Monitoring Name: Colleen Kemp  Location: [place] [address]   Infection Prevention Recommendations for Individuals Confirmed to have, or Being Evaluated for, 2019 Novel Coronavirus (COVID-19) Infection Who Receive Care at Home  Individuals who are confirmed to have, or are being evaluated for, COVID-19 should follow the prevention steps below until a healthcare provider or local or state health department says they can return to normal activities.  Stay home except to get medical care You should restrict activities outside your home, except for getting medical care. Do not go to work, school, or public areas, and do not use public transportation or taxis.  Call ahead before visiting your doctor Before your medical appointment, call the healthcare provider and tell them that you have, or are being evaluated for, COVID-19 infection. This will help the healthcare providers office take steps to keep other people from getting infected. Ask your healthcare provider to call the local or state health department.  Monitor your symptoms Seek prompt medical attention if your illness is worsening (e.g., difficulty breathing). Before going to your medical appointment, call the healthcare provider and tell them that you have, or are being evaluated for, COVID-19 infection. Ask your healthcare provider to call the local or state health department.  Wear a facemask You should wear a facemask that covers your nose and mouth when you are in the same room with other people and when you visit a healthcare provider. People who live with or visit you should also wear a facemask while they are in the same room with you.  Separate yourself from other people in your home As much as possible, you should stay in a different room from other people in your home. Also, you should use a separate bathroom, if available.  Avoid sharing household items You should not  share dishes, drinking glasses, cups, eating utensils, towels, bedding, or other items with other people in your home. After using these items, you should wash them thoroughly with soap and water.  Cover your coughs and sneezes Cover your mouth and nose with a tissue when you cough or sneeze, or you can cough or sneeze into your sleeve. Throw used tissues in a lined trash can, and immediately wash your hands with soap and water for at least 20 seconds or use an alcohol-based hand rub.  Wash your [facility] your hands often and thoroughly with soap and water for at least 20 seconds. You can use an alcohol-based hand sanitizer if soap and water are not available and if your hands are not visibly dirty. Avoid touching your eyes, nose, and mouth with unwashed hands.   Prevention Steps for Caregivers and Household Members of Individuals Confirmed to have, or Being Evaluated for, COVID-19 Infection Being Cared for in the Home  If you live with, or provide care at home for, a person confirmed to have, or being evaluated for, COVID-19 infection please follow these guidelines to prevent infection:  Follow healthcare providers instructions Make sure that you understand and can help the patient follow any healthcare provider instructions for all care.  Provide for the patients basic needs You should help the patient with basic needs in the home and provide support for getting groceries, prescriptions, and other personal needs.  Monitor the patients symptoms If they are getting sicker, call his or her medical provider and tell them that the patient has, or is being evaluated for, COVID-19 infection. This will help the healthcare providers  office take steps to keep other people from getting infected. Ask the healthcare provider to call the local or state health department.  Limit the number of people who have contact with the patient If possible, have only one caregiver for the  patient. Other household members should stay in another home or place of residence. If this is not possible, they should stay in another room, or be separated from the patient as much as possible. Use a separate bathroom, if available. Restrict visitors who do not have an essential need to be in the home.  Keep older adults, very young children, and other sick people away from the patient Keep older adults, very young children, and those who have compromised immune systems or chronic health conditions away from the patient. This includes people with chronic heart, lung, or kidney conditions, diabetes, and cancer.  Ensure good ventilation Make sure that shared spaces in the home have good air flow, such as from an air conditioner or an opened window, weather permitting.  Wash your hands often Wash your hands often and thoroughly with soap and water for at least 20 seconds. You can use an alcohol based hand sanitizer if soap and water are not available and if your hands are not visibly dirty. Avoid touching your eyes, nose, and mouth with unwashed hands. Use disposable paper towels to dry your hands. If not available, use dedicated cloth towels and replace them when they become wet.  Wear a facemask and gloves Wear a disposable facemask at all times in the room and gloves when you touch or have contact with the patients blood, body fluids, and/or secretions or excretions, such as sweat, saliva, sputum, nasal mucus, vomit, urine, or feces.  Ensure the mask fits over your nose and mouth tightly, and do not touch it during use. Throw out disposable facemasks and gloves after using them. Do not reuse. Wash your hands immediately after removing your facemask and gloves. If your personal clothing becomes contaminated, carefully remove clothing and launder. Wash your hands after handling contaminated clothing. Place all used disposable facemasks, gloves, and other waste in a lined container before  disposing them with other household waste. Remove gloves and wash your hands immediately after handling these items.  Do not share dishes, glasses, or other household items with the patient Avoid sharing household items. You should not share dishes, drinking glasses, cups, eating utensils, towels, bedding, or other items with a patient who is confirmed to have, or being evaluated for, COVID-19 infection. After the person uses these items, you should wash them thoroughly with soap and water.  Wash laundry thoroughly Immediately remove and wash clothes or bedding that have blood, body fluids, and/or secretions or excretions, such as sweat, saliva, sputum, nasal mucus, vomit, urine, or feces, on them. Wear gloves when handling laundry from the patient. Read and follow directions on labels of laundry or clothing items and detergent. In general, wash and dry with the warmest temperatures recommended on the label.  Clean all areas the individual has used often Clean all touchable surfaces, such as counters, tabletops, doorknobs, bathroom fixtures, toilets, phones, keyboards, tablets, and bedside tables, every day. Also, clean any surfaces that may have blood, body fluids, and/or secretions or excretions on them. Wear gloves when cleaning surfaces the patient has come in contact with. Use a diluted bleach solution (e.g., dilute bleach with 1 part bleach and 10 parts water) or a household disinfectant with a label that says EPA-registered for coronaviruses. To make a  bleach solution at home, add 1 tablespoon of bleach to 1 quart (4 cups) of water. For a larger supply, add  cup of bleach to 1 gallon (16 cups) of water. Read labels of cleaning products and follow recommendations provided on product labels. Labels contain instructions for safe and effective use of the cleaning product including precautions you should take when applying the product, such as wearing gloves or eye protection and making sure you  have good ventilation during use of the product. Remove gloves and wash hands immediately after cleaning.  Monitor yourself for signs and symptoms of illness Caregivers and household members are considered close contacts, should monitor their health, and will be asked to limit movement outside of the home to the extent possible. Follow the monitoring steps for close contacts listed on the symptom monitoring form.   ? If you have additional questions, contact your local health department or call the epidemiologist on call at [phone] (available 24/7). ? This guidance is subject to change. For the most up-to-date guidance from [facility], please refer to their website: YouBlogs.pl

## 2018-10-30 NOTE — ED Provider Notes (Signed)
[facility] EMERGENCY DEPARTMENT Provider Note   CSN: [id number] Arrival date & time: 10/30/18  0620     History   Chief Complaint Chief Complaint  Patient presents with  . Cough    HPI Colleen Kemp is a 53 y.o. female.     The history is provided by the patient.  Cough Cough characteristics:  Non-productive Severity:  Mild Onset quality:  Gradual Duration:  4 days Timing:  Intermittent Progression:  Unchanged Chronicity:  New Context: sick contacts   Context: not animal exposure   Context comment:  Contact with covid positive patient Relieved by:  Nothing Worsened by:  Nothing Ineffective treatments:  None tried Associated symptoms: fever, headaches and myalgias   Associated symptoms: no chest pain, no chills, no diaphoresis, no ear fullness, no ear pain, no rash, no rhinorrhea, no shortness of breath, no sinus congestion, no sore throat, no weight loss and no wheezing   Risk factors: no chemical exposure   Patient with cough, fever, HA and bodyaches x4 days has been exposed to someone with covid  Past Medical History:  Diagnosis Date  . Arthritis    oa both knees  . GERD (gastroesophageal reflux disease)   . Hypertension   . Sleep apnea    not gotten cpap yet  . Thyroid disease     Patient Active Problem List   Diagnosis Date Noted  . Osteoarthritis of both knees 12/20/2013    Past Surgical History:  Procedure Laterality Date  . ABDOMINAL HYSTERECTOMY     partial  . BREAST LUMPECTOMY     cyst  . CYSTOSCOPY WITH RETROGRADE PYELOGRAM, URETEROSCOPY AND STENT PLACEMENT Left 04/12/2015   Procedure: CYSTOSCOPY WITH Left RETROGRADE PYELOGRAM, Left URETEROSCOPY , basket stone extraction AND Left Ureteral STENT PLACEMENT;  Surgeon: Alexis Frock, MD;  Location: [facility];  Service: Urology;  Laterality: Left;  . HOLMIUM LASER APPLICATION Left 81/12/5629   Procedure: HOLMIUM LASER APPLICATION;  Surgeon: Alexis Frock, MD;  Location: [facility];   Service: Urology;  Laterality: Left;  . TUBAL LIGATION       OB History   No obstetric history on file.      Home Medications    Prior to Admission medications   Medication Sig Start Date End Date Taking? Authorizing Provider  diclofenac (VOLTAREN) 75 MG EC tablet TAKE 1 TABLET BY MOUTH 1 TO 2 TIMES DAILY 10/28/18   [provider]  DULoxetine (CYMBALTA) 60 MG capsule Take 60 mg by mouth daily. 10/28/18   [provider]  levothyroxine (SYNTHROID) 125 MCG tablet Take 125 mcg by mouth daily. 09/03/18   [provider]  rosuvastatin (CRESTOR) 10 MG tablet Take 10 mg by mouth daily. 10/22/18   [provider]    Family History Family History  Problem Relation Age of Onset  . Hypertension Mother   . Diabetes Mother   . Hypertension Father     Social History Social History   Tobacco Use  . Smoking status: Former Research scientist (life sciences)  . Smokeless tobacco: Never Used  Substance Use Topics  . Alcohol use: No  . Drug use: No     Allergies   Tramadol   Review of Systems Review of Systems  Constitutional: Positive for fever. Negative for chills, diaphoresis and weight loss.  HENT: Negative for ear pain, rhinorrhea and sore throat.   Respiratory: Positive for cough. Negative for shortness of breath and wheezing.   Cardiovascular: Negative for chest pain.  Musculoskeletal: Positive for myalgias.  Skin: Negative for rash.  Neurological: Positive for headaches.  All other systems reviewed and are negative.    Physical Exam Updated Vital Signs BP (!) 152/92 (BP Location: Right Arm)   Pulse 89   Temp 98.4 F (36.9 C) (Oral)   Resp 18   Ht 5\' 5"  (1.651 m)   Wt 91.6 kg   SpO2 97%   BMI 33.61 kg/m   Physical Exam Vitals signs and nursing note reviewed.  Constitutional:      General: She is not in acute distress.    Appearance: She is normal weight. She is not ill-appearing, toxic-appearing or diaphoretic.  HENT:     Head: Normocephalic and  atraumatic.  Eyes:     Extraocular Movements: Extraocular movements intact.     Conjunctiva/sclera: Conjunctivae normal.     Pupils: Pupils are equal, round, and reactive to light.  Neck:     Musculoskeletal: Normal range of motion and neck supple. No neck rigidity.  Cardiovascular:     Rate and Rhythm: Normal rate and regular rhythm.     Pulses: Normal pulses.     Heart sounds: Normal heart sounds.  Pulmonary:     Effort: Pulmonary effort is normal. No respiratory distress.     Breath sounds: Normal breath sounds. No wheezing or rales.  Abdominal:     General: Abdomen is flat. Bowel sounds are normal.     Tenderness: There is no abdominal tenderness. There is no rebound.  Musculoskeletal: Normal range of motion.  Lymphadenopathy:     Cervical: No cervical adenopathy.  Skin:    General: Skin is warm and dry.     Capillary Refill: Capillary refill takes less than 2 seconds.  Neurological:     General: No focal deficit present.     Mental Status: She is alert and oriented to person, place, and time.  Psychiatric:        Mood and Affect: Mood normal.        Behavior: Behavior normal.      ED Treatments / Results  Labs (all labs ordered are listed, but only abnormal results are displayed) Labs Reviewed  NOVEL CORONAVIRUS, NAA (HOSPITAL ORDER, SEND-OUT TO REF LAB)    EKG    Radiology No results found.  Procedures Procedures (including critical care time)   Home isolation and quarantine instructions given verbally and on discharge paperwork. Patient is not to leave her home excepting seeking medical care for shortness or breath, weakness, chest pain, and or stroke like symptoms.  She should stay isolated in one room in her home and is not to see others.  Work note given.    [name] was evaluated in Emergency Department on 10/30/2018 for the symptoms described in the history of present illness. She was evaluated in the context of the global COVID-19 pandemic,  which necessitated consideration that the patient might be at risk for infection with the SARS-CoV-2 virus that causes COVID-19. Institutional protocols and algorithms that pertain to the evaluation of patients at risk for COVID-19 are in a state of rapid change based on information released by regulatory bodies including the CDC and federal and state organizations. These policies and algorithms were followed during the patient's care in the ED.  Final Clinical Impressions(s) / ED Diagnoses   Final diagnoses:  Suspected Covid-19 Virus Infection   Return for intractable cough, coughing up blood,fevers >100.4 unrelieved by medication, shortness of breath, intractable vomiting, chest pain, shortness of breath, weakness,numbness, changes in speech, facial  asymmetry,abdominal pain, passing out,Inability to tolerate liquids or food, cough, altered mental status or any concerns. No signs of systemic illness or infection. The patient is nontoxic-appearing on exam and vital signs are within normal limits.   I have reviewed the triage vital signs and the nursing notes. Pertinent labs &imaging results that were available during my care of the patient were reviewed by me and considered in my medical decision making (see chart for details).  After history, exam, and medical workup I feel the patient has been appropriately medically screened and is safe for discharge home. Pertinent diagnoses were discussed with the patient. Patient was given return precautions   Race Latour, MD 10/30/18 [phone]

## 2018-10-30 NOTE — ED Notes (Signed)
Pt waiting for chest x ray results before dc

## 2018-10-30 NOTE — ED Triage Notes (Signed)
Pt c/o cough, body aches, headache, and low grade fever x 4 days. Pt states close contact with person who tested positive for COVID x 1 week ago.

## 2018-10-31 LAB — NOVEL CORONAVIRUS, NAA (HOSP ORDER, SEND-OUT TO REF LAB; TAT 18-24 HRS): SARS-CoV-2, NAA: DETECTED — AB

## 2019-01-27 DIAGNOSIS — R739 Hyperglycemia, unspecified: Secondary | ICD-10-CM | POA: Diagnosis not present

## 2019-01-27 DIAGNOSIS — F419 Anxiety disorder, unspecified: Secondary | ICD-10-CM | POA: Diagnosis not present

## 2019-01-27 DIAGNOSIS — E079 Disorder of thyroid, unspecified: Secondary | ICD-10-CM | POA: Diagnosis not present

## 2019-01-27 DIAGNOSIS — E739 Lactose intolerance, unspecified: Secondary | ICD-10-CM | POA: Diagnosis not present

## 2019-01-27 DIAGNOSIS — E785 Hyperlipidemia, unspecified: Secondary | ICD-10-CM | POA: Diagnosis not present

## 2019-01-27 DIAGNOSIS — Z79899 Other long term (current) drug therapy: Secondary | ICD-10-CM | POA: Diagnosis not present

## 2019-04-29 DIAGNOSIS — F411 Generalized anxiety disorder: Secondary | ICD-10-CM | POA: Diagnosis not present

## 2019-04-29 DIAGNOSIS — F332 Major depressive disorder, recurrent severe without psychotic features: Secondary | ICD-10-CM | POA: Diagnosis not present

## 2019-05-17 DIAGNOSIS — F99 Mental disorder, not otherwise specified: Secondary | ICD-10-CM | POA: Diagnosis not present

## 2019-05-24 DIAGNOSIS — F411 Generalized anxiety disorder: Secondary | ICD-10-CM | POA: Diagnosis not present

## 2019-05-24 DIAGNOSIS — F332 Major depressive disorder, recurrent severe without psychotic features: Secondary | ICD-10-CM | POA: Diagnosis not present

## 2019-05-25 DIAGNOSIS — F99 Mental disorder, not otherwise specified: Secondary | ICD-10-CM | POA: Diagnosis not present

## 2019-06-02 DIAGNOSIS — F99 Mental disorder, not otherwise specified: Secondary | ICD-10-CM | POA: Diagnosis not present

## 2019-06-08 ENCOUNTER — Other Ambulatory Visit: Payer: Self-pay

## 2019-06-08 ENCOUNTER — Encounter: Payer: Self-pay | Admitting: Nurse Practitioner

## 2019-06-08 ENCOUNTER — Ambulatory Visit: Payer: Federal, State, Local not specified - PPO | Admitting: Nurse Practitioner

## 2019-06-08 VITALS — BP 150/102 | HR 82 | Temp 98.7°F | Ht 65.0 in | Wt 224.4 lb

## 2019-06-08 DIAGNOSIS — I10 Essential (primary) hypertension: Secondary | ICD-10-CM

## 2019-06-08 DIAGNOSIS — E039 Hypothyroidism, unspecified: Secondary | ICD-10-CM | POA: Diagnosis not present

## 2019-06-08 DIAGNOSIS — Z6837 Body mass index (BMI) 37.0-37.9, adult: Secondary | ICD-10-CM

## 2019-06-08 DIAGNOSIS — M170 Bilateral primary osteoarthritis of knee: Secondary | ICD-10-CM

## 2019-06-08 DIAGNOSIS — Z13228 Encounter for screening for other metabolic disorders: Secondary | ICD-10-CM | POA: Diagnosis not present

## 2019-06-08 DIAGNOSIS — E669 Obesity, unspecified: Secondary | ICD-10-CM | POA: Diagnosis not present

## 2019-06-08 DIAGNOSIS — Z8616 Personal history of COVID-19: Secondary | ICD-10-CM

## 2019-06-08 MED ORDER — HYDROCHLOROTHIAZIDE 12.5 MG PO TABS: 12.5000 mg | ORAL_TABLET | Freq: Every day | ORAL | 2 refills | Status: DC

## 2019-06-08 NOTE — Progress Notes (Signed)
This visit occurred during the SARS-CoV-2 public health emergency.  Safety protocols were in place, including screening questions prior to the visit, additional usage of staff PPE, and extensive cleaning of exam room while observing appropriate contact time as indicated for disinfecting solutions.  Subjective:     Patient ID: Colleen Kemp , female    DOB: 06-25-1965 , 54 y.o.   MRN: [id number]   Chief Complaint  Patient presents with  . Establish Care  . Hypothyroidism    HPI  Here to establish care had been a patient with [facility] 20 years ago, she had been going to [place] [facility]) and followed her to [place] (3 months ago).  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  Single.  She has 3 children - healthy.    PMH - she had taken medications for hypertension, hypothyroid, osteoarthritis (knees) - she gets injections to her knees.  She works on Pensions consultant.  OSA - CPAP - she has not been using as often due to feeling claustophobic.    She had covid in July 2020 - she was exposed and got tested, she found out she was positive. Her most difficult issue is her mental space Hypertension This is a chronic problem. The current episode started more than 1 year ago. The problem is uncontrolled. Pertinent negatives include no anxiety, chest pain, headaches or palpitations. Risk factors for coronary artery disease include obesity and sedentary lifestyle. Past treatments include nothing. There are no compliance problems.  There is no history of angina. There is no history of chronic renal disease.     Past Medical History:  Diagnosis Date  . Arthritis    oa both knees  . GERD (gastroesophageal reflux disease)   . Hypertension   . Sleep apnea    not gotten cpap yet  . Thyroid disease      Family History  Problem Relation Age of Onset  . Hypertension Mother   . Diabetes Mother   . Hypertension Father      Current Outpatient Medications:  .  diclofenac (VOLTAREN) 75 MG EC  tablet, TAKE 1 TABLET BY MOUTH 1 TO 2 TIMES DAILY, Disp: , Rfl:  .  levothyroxine (SYNTHROID) 125 MCG tablet, Take 125 mcg by mouth daily., Disp: , Rfl:  .  DULoxetine (CYMBALTA) 60 MG capsule, Take 60 mg by mouth daily., Disp: , Rfl:  .  rosuvastatin (CRESTOR) 10 MG tablet, Take 10 mg by mouth daily., Disp: , Rfl:    Allergies  Allergen Reactions  . Tramadol Nausea And Vomiting     Review of Systems  Constitutional: Negative.   HENT: Negative.   Respiratory: Negative.   Cardiovascular: Negative.  Negative for chest pain, palpitations and leg swelling.  Neurological: Negative.  Negative for dizziness and headaches.  Psychiatric/Behavioral: Negative.      Today's Vitals   06/08/19 1155  BP: (!) 162/100  Pulse: 82  Temp: 98.7 F (37.1 C)  TempSrc: Oral  Weight: 224 lb 6.4 oz (101.8 kg)  Height: '5\' 5"'  (1.651 m)  PainSc: 6   PainLoc: Knee   Body mass index is 37.34 kg/m.   Objective:  Physical Exam Constitutional:      Appearance: Normal appearance.  Cardiovascular:     Rate and Rhythm: Normal rate and regular rhythm.     Pulses: Normal pulses.     Heart sounds: Normal heart sounds. No murmur.  Pulmonary:     Effort: Pulmonary effort is normal. No respiratory distress.  Breath sounds: Normal breath sounds.  Skin:    Capillary Refill: Capillary refill takes less than 2 seconds.  Neurological:     General: No focal deficit present.     Mental Status: She is alert and oriented to person, place, and time.  Psychiatric:        Mood and Affect: Mood normal.        Behavior: Behavior normal.        Thought Content: Thought content normal.        Judgment: Judgment normal.         Assessment And Plan:     1. Essential hypertension  Poorly controlled hypertension will restart her on HCTZ  Advised to limit intake of high salt foods and to stay well hydrated with water - CMP14+EGFR  2. Primary osteoarthritis of both knees  This is long term and is being  followed by orthopedics  3. Encounter for screening for metabolic disorder  - Lipid panel - Hemoglobin A1c  4. Acquired hypothyroidism  Chronic, she is taking levothyroxine and denies any issues with the medication  - TSH - T4 - T3, free  5. Obesity (BMI 30-39.9)  Lipo B injection given this visit.  6. History of COVID-19  She was positive for covid 19 in June 2020  No further issues since that time  Minette Brine, FNP    THE PATIENT IS ENCOURAGED TO PRACTICE SOCIAL DISTANCING DUE TO THE COVID-19 PANDEMIC.

## 2019-06-08 NOTE — Patient Instructions (Signed)
Hypertension, Adult Hypertension is another name for high blood pressure. High blood pressure forces your heart to work harder to pump blood. This can cause problems over time. There are two numbers in a blood pressure reading. There is a top number (systolic) over a bottom number (diastolic). It is best to have a blood pressure that is below 120/80. Healthy choices can help lower your blood pressure, or you may need medicine to help lower it. What are the causes? The cause of this condition is not known. Some conditions may be related to high blood pressure. What increases the risk?  Smoking.  Having type 2 diabetes mellitus, high cholesterol, or both.  Not getting enough exercise or physical activity.  Being overweight.  Having too much fat, sugar, calories, or salt (sodium) in your diet.  Drinking too much alcohol.  Having long-term (chronic) kidney disease.  Having a family history of high blood pressure.  Age. Risk increases with age.  Race. You may be at higher risk if you are African American.  Gender. Men are at higher risk than women before age 45. After age 65, women are at higher risk than men.  Having obstructive sleep apnea.  Stress. What are the signs or symptoms?  High blood pressure may not cause symptoms. Very high blood pressure (hypertensive crisis) may cause: ? Headache. ? Feelings of worry or nervousness (anxiety). ? Shortness of breath. ? Nosebleed. ? A feeling of being sick to your stomach (nausea). ? Throwing up (vomiting). ? Changes in how you see. ? Very bad chest pain. ? Seizures. How is this treated?  This condition is treated by making healthy lifestyle changes, such as: ? Eating healthy foods. ? Exercising more. ? Drinking less alcohol.  Your health care provider may prescribe medicine if lifestyle changes are not enough to get your blood pressure under control, and if: ? Your top number is above 130. ? Your bottom number is above  80.  Your personal target blood pressure may vary. Follow these instructions at home: Eating and drinking   If told, follow the DASH eating plan. To follow this plan: ? Fill one half of your plate at each meal with fruits and vegetables. ? Fill one fourth of your plate at each meal with whole grains. Whole grains include whole-wheat pasta, brown rice, and whole-grain bread. ? Eat or drink low-fat dairy products, such as skim milk or low-fat yogurt. ? Fill one fourth of your plate at each meal with low-fat (lean) proteins. Low-fat proteins include fish, chicken without skin, eggs, beans, and tofu. ? Avoid fatty meat, cured and processed meat, or chicken with skin. ? Avoid pre-made or processed food.  Eat less than 1,500 mg of salt each day.  Do not drink alcohol if: ? Your doctor tells you not to drink. ? You are pregnant, may be pregnant, or are planning to become pregnant.  If you drink alcohol: ? Limit how much you use to:  0-1 drink a day for women.  0-2 drinks a day for men. ? Be aware of how much alcohol is in your drink. In the [place] one drink equals one 12 oz bottle of beer (355 mL), one 5 oz glass of wine (148 mL), or one 1 oz glass of hard liquor (44 mL). Lifestyle   Work with your doctor to stay at a healthy weight or to lose weight. Ask your doctor what the best weight is for you.  Get at least 30 minutes of exercise most   days of the week. This may include walking, swimming, or biking.  Get at least 30 minutes of exercise that strengthens your muscles (resistance exercise) at least 3 days a week. This may include lifting weights or doing Pilates.  Do not use any products that contain nicotine or tobacco, such as cigarettes, e-cigarettes, and chewing tobacco. If you need help quitting, ask your doctor.  Check your blood pressure at home as told by your doctor.  Keep all follow-up visits as told by your doctor. This is important. Medicines  Take over-the-counter  and prescription medicines only as told by your doctor. Follow directions carefully.  Do not skip doses of blood pressure medicine. The medicine does not work as well if you skip doses. Skipping doses also puts you at risk for problems.  Ask your doctor about side effects or reactions to medicines that you should watch for. Contact a doctor if you:  Think you are having a reaction to the medicine you are taking.  Have headaches that keep coming back (recurring).  Feel dizzy.  Have swelling in your ankles.  Have trouble with your vision. Get help right away if you:  Get a very bad headache.  Start to feel mixed up (confused).  Feel weak or numb.  Feel faint.  Have very bad pain in your: ? Chest. ? Belly (abdomen).  Throw up more than once.  Have trouble breathing. Summary  Hypertension is another name for high blood pressure.  High blood pressure forces your heart to work harder to pump blood.  For most people, a normal blood pressure is less than 120/80.  Making healthy choices can help lower blood pressure. If your blood pressure does not get lower with healthy choices, you may need to take medicine. This information is not intended to replace advice given to you by your health care provider. Make sure you discuss any questions you have with your health care provider. Document Revised: 12/31/2017 Document Reviewed: 12/31/2017 Elsevier Patient Education  2020 [employer]. DASH Eating Plan DASH stands for "Dietary Approaches to Stop Hypertension." The DASH eating plan is a healthy eating plan that has been shown to reduce high blood pressure (hypertension). It may also reduce your risk for type 2 diabetes, heart disease, and stroke. The DASH eating plan may also help with weight loss. What are tips for following this plan?  General guidelines  Avoid eating more than 2,300 mg (milligrams) of salt (sodium) a day. If you have hypertension, you may need to reduce your  sodium intake to 1,500 mg a day.  Limit alcohol intake to no more than 1 drink a day for nonpregnant women and 2 drinks a day for men. One drink equals 12 oz of beer, 5 oz of wine, or 1 oz of hard liquor.  Work with your health care provider to maintain a healthy body weight or to lose weight. Ask what an ideal weight is for you.  Get at least 30 minutes of exercise that causes your heart to beat faster (aerobic exercise) most days of the week. Activities may include walking, swimming, or biking.  Work with your health care provider or diet and nutrition specialist (dietitian) to adjust your eating plan to your individual calorie needs. Reading food labels   Check food labels for the amount of sodium per serving. Choose foods with less than 5 percent of the Daily Value of sodium. Generally, foods with less than 300 mg of sodium per serving fit into this eating plan.    To find whole grains, look for the word "whole" as the first word in the ingredient list. Shopping  Buy products labeled as "low-sodium" or "no salt added."  Buy fresh foods. Avoid canned foods and premade or frozen meals. Cooking  Avoid adding salt when cooking. Use salt-free seasonings or herbs instead of table salt or sea salt. Check with your health care provider or pharmacist before using salt substitutes.  Do not fry foods. Cook foods using healthy methods such as baking, boiling, grilling, and broiling instead.  Cook with heart-healthy oils, such as olive, canola, soybean, or sunflower oil. Meal planning  Eat a balanced diet that includes: ? 5 or more servings of fruits and vegetables each day. At each meal, try to fill half of your plate with fruits and vegetables. ? Up to 6-8 servings of whole grains each day. ? Less than 6 oz of lean meat, poultry, or fish each day. A 3-oz serving of meat is about the same size as a deck of cards. One egg equals 1 oz. ? 2 servings of low-fat dairy each day. ? A serving of  nuts, seeds, or beans 5 times each week. ? Heart-healthy fats. Healthy fats called Omega-3 fatty acids are found in foods such as flaxseeds and coldwater fish, like sardines, salmon, and mackerel.  Limit how much you eat of the following: ? Canned or prepackaged foods. ? Food that is high in trans fat, such as fried foods. ? Food that is high in saturated fat, such as fatty meat. ? Sweets, desserts, sugary drinks, and other foods with added sugar. ? Full-fat dairy products.  Do not salt foods before eating.  Try to eat at least 2 vegetarian meals each week.  Eat more home-cooked food and less restaurant, buffet, and fast food.  When eating at a restaurant, ask that your food be prepared with less salt or no salt, if possible. What foods are recommended? The items listed may not be a complete list. Talk with your dietitian about what dietary choices are best for you. Grains Whole-grain or whole-wheat bread. Whole-grain or whole-wheat pasta. Brown rice. Oatmeal. Quinoa. Bulgur. Whole-grain and low-sodium cereals. Pita bread. Low-fat, low-sodium crackers. Whole-wheat flour tortillas. Vegetables Fresh or frozen vegetables (raw, steamed, roasted, or grilled). Low-sodium or reduced-sodium tomato and vegetable juice. Low-sodium or reduced-sodium tomato sauce and tomato paste. Low-sodium or reduced-sodium canned vegetables. Fruits All fresh, dried, or frozen fruit. Canned fruit in natural juice (without added sugar). Meat and other protein foods Skinless chicken or turkey. Ground chicken or turkey. Pork with fat trimmed off. Fish and seafood. Egg whites. Dried beans, peas, or lentils. Unsalted nuts, nut butters, and seeds. Unsalted canned beans. Lean cuts of beef with fat trimmed off. Low-sodium, lean deli meat. Dairy Low-fat (1%) or fat-free (skim) milk. Fat-free, low-fat, or reduced-fat cheeses. Nonfat, low-sodium ricotta or cottage cheese. Low-fat or nonfat yogurt. Low-fat, low-sodium  cheese. Fats and oils Soft margarine without trans fats. Vegetable oil. Low-fat, reduced-fat, or light mayonnaise and salad dressings (reduced-sodium). Canola, safflower, olive, soybean, and sunflower oils. Avocado. Seasoning and other foods Herbs. Spices. Seasoning mixes without salt. Unsalted popcorn and pretzels. Fat-free sweets. What foods are not recommended? The items listed may not be a complete list. Talk with your dietitian about what dietary choices are best for you. Grains Baked goods made with fat, such as croissants, muffins, or some breads. Dry pasta or rice meal packs. Vegetables Creamed or fried vegetables. Vegetables in a cheese sauce. Regular canned vegetables (not   low-sodium or reduced-sodium). Regular canned tomato sauce and paste (not low-sodium or reduced-sodium). Regular tomato and vegetable juice (not low-sodium or reduced-sodium). Pickles. Olives. Fruits Canned fruit in a light or heavy syrup. Fried fruit. Fruit in cream or butter sauce. Meat and other protein foods Fatty cuts of meat. Ribs. Fried meat. Bacon. Sausage. Bologna and other processed lunch meats. Salami. Fatback. Hotdogs. Bratwurst. Salted nuts and seeds. Canned beans with added salt. Canned or smoked fish. Whole eggs or egg yolks. Chicken or turkey with skin. Dairy Whole or 2% milk, cream, and half-and-half. Whole or full-fat cream cheese. Whole-fat or sweetened yogurt. Full-fat cheese. Nondairy creamers. Whipped toppings. Processed cheese and cheese spreads. Fats and oils Butter. Stick margarine. Lard. Shortening. Ghee. Bacon fat. Tropical oils, such as coconut, palm kernel, or palm oil. Seasoning and other foods Salted popcorn and pretzels. Onion salt, garlic salt, seasoned salt, table salt, and sea salt. Worcestershire sauce. Tartar sauce. Barbecue sauce. Teriyaki sauce. Soy sauce, including reduced-sodium. Steak sauce. Canned and packaged gravies. Fish sauce. Oyster sauce. Cocktail sauce. Horseradish  that you find on the shelf. Ketchup. Mustard. Meat flavorings and tenderizers. Bouillon cubes. Hot sauce and Tabasco sauce. Premade or packaged marinades. Premade or packaged taco seasonings. Relishes. Regular salad dressings. Where to find more information:  National Heart, Lung, and Blood Institute: www.nhlbi.nih.gov  American Heart Association: www.heart.org Summary  The DASH eating plan is a healthy eating plan that has been shown to reduce high blood pressure (hypertension). It may also reduce your risk for type 2 diabetes, heart disease, and stroke.  With the DASH eating plan, you should limit salt (sodium) intake to 2,300 mg a day. If you have hypertension, you may need to reduce your sodium intake to 1,500 mg a day.  When on the DASH eating plan, aim to eat more fresh fruits and vegetables, whole grains, lean proteins, low-fat dairy, and heart-healthy fats.  Work with your health care provider or diet and nutrition specialist (dietitian) to adjust your eating plan to your individual calorie needs. This information is not intended to replace advice given to you by your health care provider. Make sure you discuss any questions you have with your health care provider. Document Revised: 04/04/2017 Document Reviewed: 04/15/2016 Elsevier Patient Education  2020 Elsevier Inc.  

## 2019-06-09 LAB — LIPID PANEL
Chol/HDL Ratio: 3.4 ratio (ref 0.0–4.4)
Cholesterol, Total: 206 mg/dL — ABNORMAL HIGH (ref 100–199)
HDL: 60 mg/dL (ref >39)
LDL Chol Calc (NIH): 120 mg/dL — ABNORMAL HIGH (ref 0–99)
Triglycerides: 151 mg/dL — ABNORMAL HIGH (ref 0–149)
VLDL Cholesterol Cal: 26 mg/dL (ref 5–40)

## 2019-06-09 LAB — CMP14+EGFR
ALT: 25 IU/L (ref 0–32)
AST: 25 IU/L (ref 0–40)
Albumin/Globulin Ratio: 1.4 (ref 1.2–2.2)
Albumin: 4.7 g/dL (ref 3.8–4.9)
Alkaline Phosphatase: 62 IU/L (ref 39–117)
BUN/Creatinine Ratio: 22 (ref 9–23)
BUN: 21 mg/dL (ref 6–24)
Bilirubin Total: 0.3 mg/dL (ref 0.0–1.2)
CO2: 25 mmol/L (ref 20–29)
Calcium: 10.3 mg/dL — ABNORMAL HIGH (ref 8.7–10.2)
Chloride: 101 mmol/L (ref 96–106)
Creatinine, Ser: 0.96 mg/dL (ref 0.57–1.00)
GFR calc Af Amer: 79 mL/min/{1.73_m2} (ref >59)
GFR calc non Af Amer: 69 mL/min/{1.73_m2} (ref >59)
Globulin, Total: 3.4 g/dL (ref 1.5–4.5)
Glucose: 118 mg/dL — ABNORMAL HIGH (ref 65–99)
Potassium: 4 mmol/L (ref 3.5–5.2)
Sodium: 140 mmol/L (ref 134–144)
Total Protein: 8.1 g/dL (ref 6.0–8.5)

## 2019-06-09 LAB — HEMOGLOBIN A1C
Est. average glucose Bld gHb Est-mCnc: 148 mg/dL
Hgb A1c MFr Bld: 6.8 % — ABNORMAL HIGH (ref 4.8–5.6)

## 2019-06-09 LAB — T3, FREE: T3, Free: 2.9 pg/mL (ref 2.0–4.4)

## 2019-06-09 LAB — T4: T4, Total: 9.4 ug/dL (ref 4.5–12.0)

## 2019-06-09 LAB — TSH: TSH: 2.49 u[IU]/mL (ref 0.450–4.500)

## 2019-06-16 ENCOUNTER — Other Ambulatory Visit: Payer: Self-pay

## 2019-06-16 MED ORDER — METFORMIN HCL 500 MG PO TABS: 500.0000 mg | ORAL_TABLET | Freq: Two times a day (BID) | ORAL | 2 refills | Status: DC

## 2019-06-16 MED ORDER — ATORVASTATIN CALCIUM 10 MG PO TABS: 10.0000 mg | ORAL_TABLET | Freq: Every day | ORAL | 2 refills | Status: DC

## 2019-06-21 DIAGNOSIS — F411 Generalized anxiety disorder: Secondary | ICD-10-CM | POA: Diagnosis not present

## 2019-06-21 DIAGNOSIS — F332 Major depressive disorder, recurrent severe without psychotic features: Secondary | ICD-10-CM | POA: Diagnosis not present

## 2019-06-22 ENCOUNTER — Other Ambulatory Visit: Payer: Self-pay

## 2019-06-22 ENCOUNTER — Ambulatory Visit: Payer: Federal, State, Local not specified - PPO | Admitting: Nurse Practitioner

## 2019-06-22 ENCOUNTER — Encounter: Payer: Self-pay | Admitting: Nurse Practitioner

## 2019-06-22 VITALS — BP 138/98 | HR 81 | Temp 98.5°F | Ht 65.0 in | Wt 221.8 lb

## 2019-06-22 DIAGNOSIS — Z6836 Body mass index (BMI) 36.0-36.9, adult: Secondary | ICD-10-CM | POA: Diagnosis not present

## 2019-06-22 DIAGNOSIS — E669 Obesity, unspecified: Secondary | ICD-10-CM | POA: Diagnosis not present

## 2019-06-22 DIAGNOSIS — I10 Essential (primary) hypertension: Secondary | ICD-10-CM

## 2019-06-22 DIAGNOSIS — E039 Hypothyroidism, unspecified: Secondary | ICD-10-CM | POA: Diagnosis not present

## 2019-06-22 MED ORDER — CONTRAVE 8-90 MG PO TB12: ORAL_TABLET | ORAL | 0 refills | Status: DC

## 2019-06-22 MED ORDER — HYDROCHLOROTHIAZIDE 25 MG PO TABS: 25.0000 mg | ORAL_TABLET | Freq: Every day | ORAL | 1 refills | Status: AC

## 2019-06-22 MED ORDER — LEVOTHYROXINE SODIUM 125 MCG PO TABS: 125.0000 ug | ORAL_TABLET | Freq: Every day | ORAL | 1 refills | Status: AC

## 2019-06-22 MED ORDER — HYDROCHLOROTHIAZIDE 12.5 MG PO TABS: 25.0000 mg | ORAL_TABLET | Freq: Every day | ORAL | 1 refills | Status: DC

## 2019-06-22 NOTE — Progress Notes (Signed)
This visit occurred during the SARS-CoV-2 public health emergency.  Safety protocols were in place, including screening questions prior to the visit, additional usage of staff PPE, and extensive cleaning of exam room while observing appropriate contact time as indicated for disinfecting solutions.  Subjective:     Patient ID: Colleen Kemp , female    DOB: 05-17-1965 , 54 y.o.   MRN: [id number]   Chief Complaint  Patient presents with  . Hypertension  . Weight Loss    HPI  She has taken phentermine in the past at least 2 years ago. Stopped taking due to hot flashes.   It is hard for her to exercise due to chronic knee pain Due to the pandemic   She has a history of migraines requiring her to take 1-2 days off at a time.  She reports she will not take the imitrex would rather sleep it off.  She is unable to see due to photophobia, focus, she has had nausea in the past.   Hypertension This is a chronic problem. The current episode started more than 1 year ago. The problem is unchanged. The problem is uncontrolled. Pertinent negatives include no anxiety, blurred vision, chest pain, headaches or palpitations. Risk factors for coronary artery disease include sedentary lifestyle. Past treatments include diuretics. There are no compliance problems.  There is no history of angina or kidney disease. There is no history of chronic renal disease.  Headache  This is a chronic problem. The current episode started more than 1 year ago. The pain does not radiate. The pain quality is not similar to prior headaches. The quality of the pain is described as aching. Associated symptoms include photophobia. Pertinent negatives include no blurred vision, dizziness or tingling. Nothing aggravates the symptoms. Her past medical history is significant for hypertension.     Past Medical History:  Diagnosis Date  . Arthritis    oa both knees  . GERD (gastroesophageal reflux disease)   . Hypertension   .  Sleep apnea    not gotten cpap yet  . Thyroid disease      Family History  Problem Relation Age of Onset  . Hypertension Mother   . Diabetes Mother   . Hypertension Father      Current Outpatient Medications:  .  atorvastatin (LIPITOR) 10 MG tablet, Take 1 tablet (10 mg total) by mouth daily., Disp: 30 tablet, Rfl: 2 .  diclofenac (VOLTAREN) 75 MG EC tablet, TAKE 1 TABLET BY MOUTH 1 TO 2 TIMES DAILY, Disp: , Rfl:  .  hydrochlorothiazide (HYDRODIURIL) 12.5 MG tablet, Take 1 tablet (12.5 mg total) by mouth daily., Disp: 30 tablet, Rfl: 2 .  levothyroxine (SYNTHROID) 125 MCG tablet, Take 125 mcg by mouth daily., Disp: , Rfl:  .  metFORMIN (GLUCOPHAGE) 500 MG tablet, Take 1 tablet (500 mg total) by mouth 2 (two) times daily with a meal., Disp: 60 tablet, Rfl: 2   Allergies  Allergen Reactions  . Tramadol Nausea And Vomiting     Review of Systems  Constitutional: Negative.   Eyes: Positive for photophobia. Negative for blurred vision.  Respiratory: Negative.   Cardiovascular: Negative for chest pain, palpitations and leg swelling.  Neurological: Negative for dizziness, tingling and headaches.  Psychiatric/Behavioral: Negative.       Today's Vitals   06/22/19 1137  BP: (!) 138/98  Pulse: 81  Temp: 98.5 F (36.9 C)  Weight: 221 lb 12.8 oz (100.6 kg)  Height: 5\' 5"  (1.651 m)  PainSc: 4  PainLoc: Knee   Body mass index is 36.91 kg/m.   Objective:  Physical Exam Constitutional:      General: She is not in acute distress.    Appearance: Normal appearance.  Cardiovascular:     Rate and Rhythm: Normal rate and regular rhythm.     Pulses: Normal pulses.     Heart sounds: Normal heart sounds. No murmur.  Pulmonary:     Effort: Pulmonary effort is normal. No respiratory distress.     Breath sounds: Normal breath sounds.  Neurological:     General: No focal deficit present.     Mental Status: She is alert and oriented to person, place, and time.  Psychiatric:         Mood and Affect: Mood normal.        Behavior: Behavior normal.        Thought Content: Thought content normal.        Judgment: Judgment normal.         Assessment And Plan:     1. Obesity (BMI 30-39.9)  Will try contrave which may help with her mood as well.    Discussed importance of regular exercise as well  Return in 8 weeks for follow up - Naltrexone-buPROPion HCl ER (CONTRAVE) 8-90 MG TB12; Start 1 tablet every morning for 7 days, then 1 tablet twice daily for 7 days, then 2 tablets every morning and one every evening  Dispense: 84 tablet; Refill: 0  2. Acquired hypothyroidism  Chronic, controlled  Continue with current medications - levothyroxine (SYNTHROID) 125 MCG tablet; Take 1 tablet (125 mcg total) by mouth daily.  Dispense: 90 tablet; Refill: 1  3. Essential hypertension  Blood pressure is improved will continue HCTZ - hydrochlorothiazide (HYDRODIURIL) 25 MG tablet; Take 1 tablet (25 mg total) by mouth daily.  Dispense: 90 tablet; Refill: 1       Minette Brine, FNP    THE PATIENT IS ENCOURAGED TO PRACTICE SOCIAL DISTANCING DUE TO THE COVID-19 PANDEMIC.

## 2019-06-22 NOTE — Patient Instructions (Addendum)
   Look up [employer]  [employer]  Keep a food log and bring to the next appt  Goal to walk in place during tv commercials at least once a day every day - 1st month  Goal to walk 5 -10 minutes at a time at least 2 - 3 times a day   Take sample contrave once a day for 7 days once approved for medication

## 2019-06-23 NOTE — Addendum Note (Signed)
Addended by: Arnette Felts F on: 06/23/2019 05:37 PM   Modules accepted: Level of Service

## 2019-07-12 ENCOUNTER — Other Ambulatory Visit: Payer: Self-pay

## 2019-07-12 ENCOUNTER — Ambulatory Visit: Payer: Self-pay

## 2019-07-12 ENCOUNTER — Ambulatory Visit: Payer: Federal, State, Local not specified - PPO | Admitting: Orthopedic Surgery

## 2019-07-12 ENCOUNTER — Ambulatory Visit (INDEPENDENT_AMBULATORY_CARE_PROVIDER_SITE_OTHER): Payer: Federal, State, Local not specified - PPO

## 2019-07-12 ENCOUNTER — Ambulatory Visit: Payer: Federal, State, Local not specified - PPO | Attending: Internal Medicine

## 2019-07-12 VITALS — Ht 65.0 in | Wt 221.4 lb

## 2019-07-12 DIAGNOSIS — M1712 Unilateral primary osteoarthritis, left knee: Secondary | ICD-10-CM

## 2019-07-12 DIAGNOSIS — M1711 Unilateral primary osteoarthritis, right knee: Secondary | ICD-10-CM | POA: Diagnosis not present

## 2019-07-12 DIAGNOSIS — Z23 Encounter for immunization: Secondary | ICD-10-CM | POA: Insufficient documentation

## 2019-07-12 NOTE — Progress Notes (Signed)
    Covid-19 Vaccination Clinic  Name:  Colleen Kemp    MRN: [id number] DOB: November 01, 1965  07/12/2019  Colleen Kemp was observed post Covid-19 immunization for 15 minutes without incident. She was provided with Vaccine Information Sheet and instruction to access the V-Safe system.   Colleen Kemp was instructed to call 911 with any severe reactions post vaccine: Marland Kitchen Difficulty breathing  . Swelling of face and throat  . A fast heartbeat  . A bad rash all over body  . Dizziness and weakness   Immunizations Administered    Name Date Dose VIS Date Route   Pfizer COVID-19 Vaccine 07/12/2019  2:16 PM 0.3 mL 04/16/2019 Intramuscular   Manufacturer: [employer], [employer]   Lot: [id number]   NDC: [address]-3

## 2019-07-15 ENCOUNTER — Encounter: Payer: Self-pay | Admitting: Orthopedic Surgery

## 2019-07-15 DIAGNOSIS — F411 Generalized anxiety disorder: Secondary | ICD-10-CM | POA: Diagnosis not present

## 2019-07-15 DIAGNOSIS — F332 Major depressive disorder, recurrent severe without psychotic features: Secondary | ICD-10-CM | POA: Diagnosis not present

## 2019-07-15 NOTE — Progress Notes (Signed)
Office Visit Note   Patient: Colleen Kemp           Date of Birth: August 11, 1965           MRN: [id number] Visit Date: 07/12/2019 Requested by: Minette Brine, [address] [place],  [place] [address] PCP: Minette Brine, FNP  Subjective: Chief Complaint  Patient presents with  . Right Knee - Pain  . Left Knee - Pain    HPI: Patient presents for evaluation of bilateral knee pain left worse than right.  Patient describes general debilitating pain worse with ambulation and activity.  Describes weakness and giving way with some popping but no discrete locking.  Does not wake her from sleep at night.  Diclofenac helps some but she is not currently taking any medication by choice.  She works at the [employer].  She does have a history of cortisone and gel injections which have not helped much.  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              ROS: All systems reviewed are negative as they relate to the chief complaint within the history of present illness.  Patient denies  fevers or chills.   Assessment & Plan: Visit Diagnoses:  1. Unilateral primary osteoarthritis, left knee   2. Unilateral primary osteoarthritis, right knee     Plan: Impression is bilateral knee arthritis.  Plan is that she is only on side for knee replacement.  Arthritis is end-stage.  She is really failed all types of conservative management.  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  Note is provided to that effect.  Follow-up with me as needed.  Follow-Up Instructions: Return if symptoms worsen or fail to improve.   Orders:  Orders Placed This Encounter  Procedures  . XR Knee 1-2 Views Right  . XR KNEE 3 VIEW LEFT   No orders of the defined types were  placed in this encounter.     Procedures: No procedures performed   Clinical Data: No additional findings.  Objective: Vital Signs: Ht 5\' 5"  (1.651 m)   Wt 221 lb 6.4 oz (100.4 kg)   BMI 36.84 kg/m   Physical Exam:   Constitutional: Patient appears well-developed HEENT:  Head: Normocephalic Eyes:EOM are normal Neck: Normal range of motion Cardiovascular: Normal rate Pulmonary/chest: Effort normal Neurologic: Patient is alert Skin: Skin is warm Psychiatric: Patient has normal mood and affect    Ortho Exam: Ortho exam demonstrates full active and passive range of motion of the hips.  She does have slight flexion contracture with both knees.  Flexion is easily past 90.  Extensor mechanism is intact.  Varus alignment present.  Medial greater than lateral joint line tenderness is noted.  Collateral and cruciate ligaments are stable.  No masses lymphadenopathy or skin changes noted in the knee region.  Specialty Comments:  No specialty comments available.  Imaging: No results found.   PMFS History: Patient Active Problem List   Diagnosis Date Noted  . Osteoarthritis of both knees 12/20/2013   Past Medical History:  Diagnosis Date  . Arthritis    oa both knees  . GERD (gastroesophageal reflux disease)   . Hypertension   . Sleep apnea  not gotten cpap yet  . Thyroid disease     Family History  Problem Relation Age of Onset  . Hypertension Mother   . Diabetes Mother   . Hypertension Father     Past Surgical History:  Procedure Laterality Date  . ABDOMINAL HYSTERECTOMY     partial  . BREAST LUMPECTOMY     cyst  . CYSTOSCOPY WITH RETROGRADE PYELOGRAM, URETEROSCOPY AND STENT PLACEMENT Left 04/12/2015   Procedure: CYSTOSCOPY WITH Left RETROGRADE PYELOGRAM, Left URETEROSCOPY , basket stone extraction AND Left Ureteral STENT PLACEMENT;  Surgeon: Sebastian Ache, MD;  Location: [facility];  Service: Urology;  Laterality: Left;  . HOLMIUM LASER APPLICATION Left  04/12/2015   Procedure: HOLMIUM LASER APPLICATION;  Surgeon: Sebastian Ache, MD;  Location: [facility];  Service: Urology;  Laterality: Left;  . TUBAL LIGATION     Social History   Occupational History  . Not on file  Tobacco Use  . Smoking status: Never Smoker  . Smokeless tobacco: Never Used  Substance and Sexual Activity  . Alcohol use: No  . Drug use: No  . Sexual activity: Not on file

## 2019-08-10 DIAGNOSIS — F332 Major depressive disorder, recurrent severe without psychotic features: Secondary | ICD-10-CM | POA: Diagnosis not present

## 2019-08-10 DIAGNOSIS — F411 Generalized anxiety disorder: Secondary | ICD-10-CM | POA: Diagnosis not present

## 2019-08-11 ENCOUNTER — Ambulatory Visit: Payer: Federal, State, Local not specified - PPO | Admitting: Orthopedic Surgery

## 2019-08-11 ENCOUNTER — Ambulatory Visit: Payer: Federal, State, Local not specified - PPO | Attending: Internal Medicine

## 2019-08-11 ENCOUNTER — Other Ambulatory Visit: Payer: Self-pay

## 2019-08-11 ENCOUNTER — Encounter: Payer: Self-pay | Admitting: Orthopedic Surgery

## 2019-08-11 ENCOUNTER — Telehealth: Payer: Self-pay | Admitting: Radiology

## 2019-08-11 VITALS — Ht 65.0 in | Wt 221.0 lb

## 2019-08-11 DIAGNOSIS — M1712 Unilateral primary osteoarthritis, left knee: Secondary | ICD-10-CM

## 2019-08-11 DIAGNOSIS — Z23 Encounter for immunization: Secondary | ICD-10-CM

## 2019-08-11 DIAGNOSIS — M1711 Unilateral primary osteoarthritis, right knee: Secondary | ICD-10-CM | POA: Diagnosis not present

## 2019-08-11 MED ORDER — DICLOFENAC SODIUM 75 MG PO TBEC: DELAYED_RELEASE_TABLET | ORAL | 0 refills | Status: DC

## 2019-08-11 NOTE — Progress Notes (Signed)
   Covid-19 Vaccination Clinic  Name:  SHAYANN GARBUTT    MRN: [id number] DOB: 1965-10-27  08/11/2019  Ms. Corey was observed post Covid-19 immunization for 15 minutes without incident. She was provided with Vaccine Information Sheet and instruction to access the V-Safe system.   Ms. Axelson was instructed to call 911 with any severe reactions post vaccine: Marland Kitchen Difficulty breathing  . Swelling of face and throat  . A fast heartbeat  . A bad rash all over body  . Dizziness and weakness   Immunizations Administered    Name Date Dose VIS Date Route   Pfizer COVID-19 Vaccine 08/11/2019 12:32 PM 0.3 mL 04/16/2019 Intramuscular   Manufacturer: [employer], [employer]   Lot: [id number]   NDC: [address]-2

## 2019-08-11 NOTE — Telephone Encounter (Signed)
Please get approval for Bilateral gel injections

## 2019-08-11 NOTE — Telephone Encounter (Signed)
Submitted for VOB for Durolane-Bilateral knee 

## 2019-08-12 ENCOUNTER — Telehealth: Payer: Self-pay

## 2019-08-12 NOTE — Telephone Encounter (Signed)
Pt informed of approval and copay and stated understanding. Appointment was scheduled

## 2019-08-12 NOTE — Telephone Encounter (Signed)
Approved for Durolane-Bilateral knee Dr. Myrtie Cruise and Bill $40 copay 30% OOP No prior auth required

## 2019-08-18 ENCOUNTER — Encounter: Payer: Self-pay | Admitting: Orthopedic Surgery

## 2019-08-18 NOTE — Progress Notes (Signed)
Office Visit Note   Patient: Colleen Kemp           Date of Birth: 25-Feb-1966           MRN: [id number] Visit Date: 08/11/2019 Requested by: Minette Brine, [address] [place],  [place] [address] PCP: Minette Brine, FNP  Subjective: Chief Complaint  Patient presents with  . Right Knee - Pain  . Left Knee - Pain   HPI: Colleen Kemp is a patient with known bilateral knee arthritis who was last seen about a month ago.  Her symptoms have continued.  She is working at the [employer] but does better with sedentary type work.              ROS: All systems reviewed are negative as they relate to the chief complaint within the history of present illness.  Patient denies  fevers or chills.   Assessment & Plan: Visit Diagnoses: No diagnosis found.  Plan: Impression is bilateral knee arthritis.  Plan is Voltaren.  I think we should also try for knee gel injections.  She is had cortisone in the past that has not helped.  She wants to avoid knee replacement if possible.  She is young.  We will see her back in about 2 or 3 weeks for bilateral gel injections of the knee.  Hopefully that can give her enough relief that she can delay her knee replacement  Follow-Up Instructions: No follow-ups on file.   Orders:  No orders of the defined types were placed in this encounter.  Meds ordered this encounter  Medications  . diclofenac (VOLTAREN) 75 MG EC tablet    Sig: Take 1 tablet bid for 2 weeks then qd prn    Dispense:  60 tablet    Refill:  0      Procedures: No procedures performed   Clinical Data: No additional findings.  Objective: Vital Signs: Ht 5\' 5"  (1.651 m)   Wt 221 lb (100.2 kg)   BMI 36.78 kg/m   Physical Exam:   Constitutional: Patient appears well-developed HEENT:  Head: Normocephalic Eyes:EOM are normal Neck: Normal range of motion Cardiovascular: Normal rate Pulmonary/chest: Effort normal Neurologic: Patient is alert Skin: Skin is  warm Psychiatric: Patient has normal mood and affect    Ortho Exam: Ortho exam demonstrates full active and passive range of motion of the hips and ankles.  She has slight varus alignment both knees.  Extensor mechanism is intact.  Collateral and cruciate ligaments are stable.  No masses lymphadenopathy or skin changes noted in the knee region.  She does have medial and lateral joint line tenderness  This patient is diagnosed with osteoarthritis of the knee(s).    Radiographs show evidence of joint space narrowing, osteophytes, subchondral sclerosis and/or subchondral cysts.  This patient has knee pain which interferes with functional and activities of daily living.    This patient has experienced inadequate response, adverse effects and/or intolerance with conservative treatments such as acetaminophen, NSAIDS, topical creams, physical therapy or regular exercise, knee bracing and/or weight loss.   This patient has experienced inadequate response or has a contraindication to intra articular steroid injections for at least 3 months.   This patient is not scheduled to have a total knee replacement within 6 months of starting treatment with viscosupplementation.   Specialty Comments:  No specialty comments available.  Imaging: No results found.   PMFS History: Patient Active Problem List   Diagnosis Date Noted  . Osteoarthritis  of both knees 12/20/2013   Past Medical History:  Diagnosis Date  . Arthritis    oa both knees  . GERD (gastroesophageal reflux disease)   . Hypertension   . Sleep apnea    not gotten cpap yet  . Thyroid disease     Family History  Problem Relation Age of Onset  . Hypertension Mother   . Diabetes Mother   . Hypertension Father     Past Surgical History:  Procedure Laterality Date  . ABDOMINAL HYSTERECTOMY     partial  . BREAST LUMPECTOMY     cyst  . CYSTOSCOPY WITH RETROGRADE PYELOGRAM, URETEROSCOPY AND STENT PLACEMENT Left 04/12/2015    Procedure: CYSTOSCOPY WITH Left RETROGRADE PYELOGRAM, Left URETEROSCOPY , basket stone extraction AND Left Ureteral STENT PLACEMENT;  Surgeon: Sebastian Ache, MD;  Location: [facility];  Service: Urology;  Laterality: Left;  . HOLMIUM LASER APPLICATION Left 04/12/2015   Procedure: HOLMIUM LASER APPLICATION;  Surgeon: Sebastian Ache, MD;  Location: [facility];  Service: Urology;  Laterality: Left;  . TUBAL LIGATION     Social History   Occupational History  . Not on file  Tobacco Use  . Smoking status: Never Smoker  . Smokeless tobacco: Never Used  Substance and Sexual Activity  . Alcohol use: No  . Drug use: No  . Sexual activity: Not on file

## 2019-08-23 ENCOUNTER — Ambulatory Visit: Payer: Federal, State, Local not specified - PPO | Admitting: Nurse Practitioner

## 2019-08-25 ENCOUNTER — Other Ambulatory Visit: Payer: Self-pay

## 2019-08-25 ENCOUNTER — Encounter: Payer: Self-pay | Admitting: Orthopedic Surgery

## 2019-08-25 ENCOUNTER — Ambulatory Visit: Payer: Federal, State, Local not specified - PPO | Admitting: Orthopedic Surgery

## 2019-08-25 DIAGNOSIS — M1712 Unilateral primary osteoarthritis, left knee: Secondary | ICD-10-CM

## 2019-08-25 DIAGNOSIS — M1711 Unilateral primary osteoarthritis, right knee: Secondary | ICD-10-CM | POA: Diagnosis not present

## 2019-08-26 ENCOUNTER — Encounter: Payer: Self-pay | Admitting: Orthopedic Surgery

## 2019-08-26 DIAGNOSIS — M1711 Unilateral primary osteoarthritis, right knee: Secondary | ICD-10-CM

## 2019-08-26 DIAGNOSIS — M1712 Unilateral primary osteoarthritis, left knee: Secondary | ICD-10-CM

## 2019-08-26 MED ORDER — LIDOCAINE HCL 1 % IJ SOLN
5.0000 mL | INTRAMUSCULAR | Status: AC | PRN
  Administered 2019-08-26: 5 mL

## 2019-08-26 MED ORDER — SODIUM HYALURONATE 60 MG/3ML IX PRSY
60.0000 mg | PREFILLED_SYRINGE | INTRA_ARTICULAR | Status: AC | PRN
  Administered 2019-08-26: 60 mg via INTRA_ARTICULAR

## 2019-08-26 MED ORDER — SODIUM HYALURONATE 60 MG/3ML IX PRSY
60.0000 mg | PREFILLED_SYRINGE | INTRA_ARTICULAR | Status: AC | PRN
  Administered 2019-08-26: 10:00:00 60 mg via INTRA_ARTICULAR

## 2019-08-26 NOTE — Progress Notes (Signed)
   Procedure Note  Patient: Colleen Kemp             Date of Birth: [birth year]           MRN: [id number]             Visit Date: 08/25/2019  Procedures: Visit Diagnoses:  1. Unilateral primary osteoarthritis, left knee   2. Unilateral primary osteoarthritis, right knee     Large Joint Inj: bilateral knee on 08/26/2019 9:44 AM Indications: diagnostic evaluation, joint swelling and pain Details: 18 G 1.5 in needle, superolateral approach  Arthrogram: No  Medications (Right): 5 mL lidocaine 1 %; 60 mg Sodium Hyaluronate 60 MG/3ML Medications (Left): 5 mL lidocaine 1 %; 60 mg Sodium Hyaluronate 60 MG/3ML Outcome: tolerated well, no immediate complications Procedure, treatment alternatives, risks and benefits explained, specific risks discussed. Consent was given by the patient. Immediately prior to procedure a time out was called to verify the correct patient, procedure, equipment, support staff and site/side marked as required. Patient was prepped and draped in the usual sterile fashion.

## 2019-10-25 DIAGNOSIS — E119 Type 2 diabetes mellitus without complications: Secondary | ICD-10-CM | POA: Diagnosis not present

## 2019-10-25 DIAGNOSIS — E890 Postprocedural hypothyroidism: Secondary | ICD-10-CM | POA: Diagnosis not present

## 2019-10-25 DIAGNOSIS — I10 Essential (primary) hypertension: Secondary | ICD-10-CM | POA: Diagnosis not present

## 2019-10-25 DIAGNOSIS — E785 Hyperlipidemia, unspecified: Secondary | ICD-10-CM | POA: Diagnosis not present

## 2019-11-15 DIAGNOSIS — R238 Other skin changes: Secondary | ICD-10-CM | POA: Diagnosis not present

## 2019-11-15 DIAGNOSIS — Z1231 Encounter for screening mammogram for malignant neoplasm of breast: Secondary | ICD-10-CM | POA: Diagnosis not present

## 2019-11-15 DIAGNOSIS — N6323 Unspecified lump in the left breast, lower outer quadrant: Secondary | ICD-10-CM | POA: Diagnosis not present

## 2019-11-15 DIAGNOSIS — R928 Other abnormal and inconclusive findings on diagnostic imaging of breast: Secondary | ICD-10-CM | POA: Diagnosis not present

## 2019-11-25 DIAGNOSIS — E669 Obesity, unspecified: Secondary | ICD-10-CM | POA: Diagnosis not present

## 2019-11-25 DIAGNOSIS — I10 Essential (primary) hypertension: Secondary | ICD-10-CM | POA: Diagnosis not present

## 2019-11-25 DIAGNOSIS — E782 Mixed hyperlipidemia: Secondary | ICD-10-CM | POA: Diagnosis not present

## 2019-11-25 DIAGNOSIS — E119 Type 2 diabetes mellitus without complications: Secondary | ICD-10-CM | POA: Diagnosis not present

## 2019-12-13 DIAGNOSIS — H2513 Age-related nuclear cataract, bilateral: Secondary | ICD-10-CM | POA: Diagnosis not present

## 2019-12-13 DIAGNOSIS — H11001 Unspecified pterygium of right eye: Secondary | ICD-10-CM | POA: Diagnosis not present

## 2019-12-13 DIAGNOSIS — E119 Type 2 diabetes mellitus without complications: Secondary | ICD-10-CM | POA: Diagnosis not present

## 2019-12-13 DIAGNOSIS — Z7984 Long term (current) use of oral hypoglycemic drugs: Secondary | ICD-10-CM | POA: Diagnosis not present

## 2019-12-30 DIAGNOSIS — E119 Type 2 diabetes mellitus without complications: Secondary | ICD-10-CM | POA: Diagnosis not present

## 2020-01-25 DIAGNOSIS — E119 Type 2 diabetes mellitus without complications: Secondary | ICD-10-CM | POA: Diagnosis not present

## 2020-01-25 DIAGNOSIS — I10 Essential (primary) hypertension: Secondary | ICD-10-CM | POA: Diagnosis not present

## 2020-01-25 DIAGNOSIS — E890 Postprocedural hypothyroidism: Secondary | ICD-10-CM | POA: Diagnosis not present

## 2020-01-25 DIAGNOSIS — E782 Mixed hyperlipidemia: Secondary | ICD-10-CM | POA: Diagnosis not present

## 2020-01-26 ENCOUNTER — Ambulatory Visit (INDEPENDENT_AMBULATORY_CARE_PROVIDER_SITE_OTHER): Payer: Federal, State, Local not specified - PPO | Admitting: Orthopedic Surgery

## 2020-01-26 ENCOUNTER — Encounter: Payer: Self-pay | Admitting: Orthopedic Surgery

## 2020-01-26 DIAGNOSIS — M1711 Unilateral primary osteoarthritis, right knee: Secondary | ICD-10-CM | POA: Diagnosis not present

## 2020-01-26 DIAGNOSIS — M1712 Unilateral primary osteoarthritis, left knee: Secondary | ICD-10-CM

## 2020-01-26 MED ORDER — LIDOCAINE HCL 1 % IJ SOLN
5.0000 mL | INTRAMUSCULAR | Status: AC | PRN
  Administered 2020-01-26: 5 mL

## 2020-01-26 MED ORDER — BUPIVACAINE HCL 0.25 % IJ SOLN
4.0000 mL | INTRAMUSCULAR | Status: AC | PRN
  Administered 2020-01-26: 4 mL via INTRA_ARTICULAR

## 2020-01-26 MED ORDER — METHYLPREDNISOLONE ACETATE 40 MG/ML IJ SUSP
40.0000 mg | INTRAMUSCULAR | Status: AC | PRN
  Administered 2020-01-26: 40 mg via INTRA_ARTICULAR

## 2020-01-26 NOTE — Progress Notes (Signed)
Office Visit Note   Patient: Colleen Kemp           Date of Birth: April 02, 1966           MRN: [id number] Visit Date: 01/26/2020 Requested by: Arnette Felts, FNP [address] [place],  [place] [address] PCP: Arnette Felts, FNP  Subjective: Chief Complaint  Patient presents with  . Right Knee - Pain  . Left Knee - Pain    HPI: Colleen Kemp is a 54 year old patient with bilateral knee pain left worse than right.  Hard for her to shop.  She cannot walk more than 200 feet without stopping.  Pain does not wake her from sleep.  Takes occasional diclofenac but not all the time.  Denies any mechanical symptoms in the knees.  She did have gel injections about 4 months ago.  Having some recurrent pain now and would like to have cortisone injections.              ROS: All systems reviewed are negative as they relate to the chief complaint within the history of present illness.  Patient denies  fevers or chills.   Assessment & Plan: Visit Diagnoses:  1. Unilateral primary osteoarthritis, left knee   2. Unilateral primary osteoarthritis, right knee     Plan: Impression is bilateral knee pain left worse than right with trace effusion in the left knee but no effusion in the right knee.  Bilateral knees injected with cortisone today.  Left knee also aspirated.  Preapproved for gel injection.  She wants to hold off on knee replacement as long as possible.  Nonweightbearing quad strengthening exercises encouraged.  Follow-up in December for planned gel injections.  Follow-Up Instructions: No follow-ups on file.   Orders:  No orders of the defined types were placed in this encounter.  No orders of the defined types were placed in this encounter.     Procedures: Large Joint Inj: bilateral knee on 01/26/2020 7:43 PM Indications: diagnostic evaluation, joint swelling and pain Details: 18 G 1.5 in needle, superolateral approach  Arthrogram: No  Medications (Right): 5 mL lidocaine 1 %; 40  mg methylPREDNISolone acetate 40 MG/ML; 4 mL bupivacaine 0.25 % Medications (Left): 5 mL lidocaine 1 %; 40 mg methylPREDNISolone acetate 40 MG/ML; 4 mL bupivacaine 0.25 % Outcome: tolerated well, no immediate complications Procedure, treatment alternatives, risks and benefits explained, specific risks discussed. Consent was given by the patient. Immediately prior to procedure a time out was called to verify the correct patient, procedure, equipment, support staff and site/side marked as required. Patient was prepped and draped in the usual sterile fashion.       Clinical Data: No additional findings.  Objective: Vital Signs: There were no vitals taken for this visit.  Physical Exam:   Constitutional: Patient appears well-developed HEENT:  Head: Normocephalic Eyes:EOM are normal Neck: Normal range of motion Cardiovascular: Normal rate Pulmonary/chest: Effort normal Neurologic: Patient is alert Skin: Skin is warm Psychiatric: Patient has normal mood and affect    Ortho Exam: Ortho exam demonstrates normal gait alignment.  Palpable pedal pulses.  Trace effusion left knee no effusion right knee.  Patient does have full extension and flexion past 90 bilaterally.  Collateral crucial ligaments are stable.  Not much in the way of patellofemoral crepitus or apprehension.  Extensor mechanism is intact.  No joint line tenderness laterally mild joint line tenderness medially in both knees.  Specialty Comments:  No specialty comments available.  Imaging: No results found.  PMFS History: Patient Active Problem List   Diagnosis Date Noted  . Osteoarthritis of both knees 12/20/2013   Past Medical History:  Diagnosis Date  . Arthritis    oa both knees  . GERD (gastroesophageal reflux disease)   . Hypertension   . Sleep apnea    not gotten cpap yet  . Thyroid disease     Family History  Problem Relation Age of Onset  . Hypertension Mother   . Diabetes Mother   . Hypertension  Father     Past Surgical History:  Procedure Laterality Date  . ABDOMINAL HYSTERECTOMY     partial  . BREAST LUMPECTOMY     cyst  . CYSTOSCOPY WITH RETROGRADE PYELOGRAM, URETEROSCOPY AND STENT PLACEMENT Left 04/12/2015   Procedure: CYSTOSCOPY WITH Left RETROGRADE PYELOGRAM, Left URETEROSCOPY , basket stone extraction AND Left Ureteral STENT PLACEMENT;  Surgeon: Sebastian Ache, MD;  Location: [facility];  Service: Urology;  Laterality: Left;  . HOLMIUM LASER APPLICATION Left 04/12/2015   Procedure: HOLMIUM LASER APPLICATION;  Surgeon: Sebastian Ache, MD;  Location: [facility];  Service: Urology;  Laterality: Left;  . TUBAL LIGATION     Social History   Occupational History  . Not on file  Tobacco Use  . Smoking status: Never Smoker  . Smokeless tobacco: Never Used  Substance and Sexual Activity  . Alcohol use: No  . Drug use: No  . Sexual activity: Not on file

## 2020-02-26 ENCOUNTER — Other Ambulatory Visit: Payer: Self-pay | Admitting: Nurse Practitioner

## 2020-02-26 DIAGNOSIS — E039 Hypothyroidism, unspecified: Secondary | ICD-10-CM

## 2020-03-18 ENCOUNTER — Other Ambulatory Visit: Payer: Self-pay | Admitting: Nurse Practitioner

## 2020-03-18 DIAGNOSIS — I10 Essential (primary) hypertension: Secondary | ICD-10-CM

## 2020-03-22 DIAGNOSIS — R300 Dysuria: Secondary | ICD-10-CM | POA: Diagnosis not present

## 2020-03-22 DIAGNOSIS — N898 Other specified noninflammatory disorders of vagina: Secondary | ICD-10-CM | POA: Diagnosis not present

## 2020-03-22 DIAGNOSIS — I10 Essential (primary) hypertension: Secondary | ICD-10-CM | POA: Diagnosis not present

## 2020-03-22 DIAGNOSIS — B372 Candidiasis of skin and nail: Secondary | ICD-10-CM | POA: Diagnosis not present

## 2020-03-27 DIAGNOSIS — R0602 Shortness of breath: Secondary | ICD-10-CM | POA: Diagnosis not present

## 2020-03-27 DIAGNOSIS — Z79899 Other long term (current) drug therapy: Secondary | ICD-10-CM | POA: Diagnosis not present

## 2020-03-27 DIAGNOSIS — R635 Abnormal weight gain: Secondary | ICD-10-CM | POA: Diagnosis not present

## 2020-03-27 DIAGNOSIS — I10 Essential (primary) hypertension: Secondary | ICD-10-CM | POA: Diagnosis not present

## 2020-03-27 DIAGNOSIS — E559 Vitamin D deficiency, unspecified: Secondary | ICD-10-CM | POA: Diagnosis not present

## 2020-03-27 DIAGNOSIS — R5383 Other fatigue: Secondary | ICD-10-CM | POA: Diagnosis not present

## 2020-03-27 DIAGNOSIS — Z20822 Contact with and (suspected) exposure to covid-19: Secondary | ICD-10-CM | POA: Diagnosis not present

## 2020-03-27 DIAGNOSIS — E039 Hypothyroidism, unspecified: Secondary | ICD-10-CM | POA: Diagnosis not present

## 2020-03-27 DIAGNOSIS — E7800 Pure hypercholesterolemia, unspecified: Secondary | ICD-10-CM | POA: Diagnosis not present

## 2020-04-19 ENCOUNTER — Ambulatory Visit: Payer: Federal, State, Local not specified - PPO | Admitting: Orthopedic Surgery

## 2020-04-19 ENCOUNTER — Encounter: Payer: Self-pay | Admitting: Orthopedic Surgery

## 2020-04-19 ENCOUNTER — Telehealth: Payer: Self-pay

## 2020-04-19 DIAGNOSIS — M1711 Unilateral primary osteoarthritis, right knee: Secondary | ICD-10-CM | POA: Diagnosis not present

## 2020-04-19 DIAGNOSIS — M1712 Unilateral primary osteoarthritis, left knee: Secondary | ICD-10-CM | POA: Diagnosis not present

## 2020-04-19 MED ORDER — DICLOFENAC SODIUM 75 MG PO TBEC: 75.0000 mg | DELAYED_RELEASE_TABLET | Freq: Two times a day (BID) | ORAL | 0 refills | Status: DC

## 2020-04-19 NOTE — Telephone Encounter (Signed)
Talked with [facility] and was advised that no PA is required for 03009 and [id number] (Durolane). Reference# [id number]/2020

## 2020-04-20 ENCOUNTER — Telehealth: Payer: Self-pay

## 2020-04-20 NOTE — Telephone Encounter (Signed)
Approved, Durolane, bilateral knee. Buy & Bill Patient will be responsible for 30% OOP. Co-pay of $40.00 No PA required  Appt.was scheduled on 04/19/2020 with Dr. August Saucer

## 2020-04-23 ENCOUNTER — Encounter: Payer: Self-pay | Admitting: Orthopedic Surgery

## 2020-04-23 NOTE — Progress Notes (Signed)
Office Visit Note   Patient: Colleen Kemp           Date of Birth: 08-Apr-1966           MRN: [id number] Visit Date: 04/19/2020 Requested by: Arnette Felts, FNP [address] [place],  [place] [address] PCP: Arnette Felts, FNP  Subjective: Chief Complaint  Patient presents with  . Left Knee - Pain  . Right Knee - Pain    HPI: Colleen Kemp is a 54 year old patient with bilateral knee pain.  Did get good relief from injection done 01/26/2020 in the left knee until about 2 weeks ago.  Also had prior injection of Durolane in both knees in April of this year.  Reports continued pain with ambulation.  She has end-stage knee arthritis.              ROS: All systems reviewed are negative as they relate to the chief complaint within the history of present illness.  Patient denies  fevers or chills.   Assessment & Plan: Visit Diagnoses:  1. Unilateral primary osteoarthritis, left knee   2. Unilateral primary osteoarthritis, right knee     Plan: Impression is bilateral knee arthritis.  Plan is to do a trial of diclofenac 75 mg once a day to twice a day as needed.  We will try to preauthorize gel injections as well.  She has had good results with those in the past.  Wants to avoid knee replacement as long as possible.  Follow-up in approximately 4 weeks once we get approval for the knee injections.This patient is diagnosed with osteoarthritis of the knee(s).    Radiographs show evidence of joint space narrowing, osteophytes, subchondral sclerosis and/or subchondral cysts.  This patient has knee pain which interferes with functional and activities of daily living.    This patient has experienced inadequate response, adverse effects and/or intolerance with conservative treatments such as acetaminophen, NSAIDS, topical creams, physical therapy or regular exercise, knee bracing and/or weight loss.   This patient has experienced inadequate response or has a contraindication to intra  articular steroid injections for at least 3 months.   This patient is not scheduled to have a total knee replacement within 6 months of starting treatment with viscosupplementation.   Follow-Up Instructions: Return in about 4 weeks (around 05/17/2020).   Orders:  No orders of the defined types were placed in this encounter.  Meds ordered this encounter  Medications  . diclofenac (VOLTAREN) 75 MG EC tablet    Sig: Take 1 tablet (75 mg total) by mouth 2 (two) times daily.    Dispense:  60 tablet    Refill:  0      Procedures: No procedures performed   Clinical Data: No additional findings.  Objective: Vital Signs: There were no vitals taken for this visit.  Physical Exam:   Constitutional: Patient appears well-developed HEENT:  Head: Normocephalic Eyes:EOM are normal Neck: Normal range of motion Cardiovascular: Normal rate Pulmonary/chest: Effort normal Neurologic: Patient is alert Skin: Skin is warm Psychiatric: Patient has normal mood and affect    Ortho Exam: Ortho exam demonstrates no effusion in either knee.  Collateral crucial ligaments are stable.  Extensor mechanism is intact.  No groin pain with internal X rotation leg.  Pedal pulse palpable.  Patient has near full extension and flexion past 90 degrees in both knees.  Specialty Comments:  No specialty comments available.  Imaging: No results found.   PMFS History: Patient Active Problem List  Diagnosis Date Noted  . Osteoarthritis of both knees 12/20/2013   Past Medical History:  Diagnosis Date  . Arthritis    oa both knees  . GERD (gastroesophageal reflux disease)   . Hypertension   . Sleep apnea    not gotten cpap yet  . Thyroid disease     Family History  Problem Relation Age of Onset  . Hypertension Mother   . Diabetes Mother   . Hypertension Father     Past Surgical History:  Procedure Laterality Date  . ABDOMINAL HYSTERECTOMY     partial  . BREAST LUMPECTOMY     cyst  .  CYSTOSCOPY WITH RETROGRADE PYELOGRAM, URETEROSCOPY AND STENT PLACEMENT Left 04/12/2015   Procedure: CYSTOSCOPY WITH Left RETROGRADE PYELOGRAM, Left URETEROSCOPY , basket stone extraction AND Left Ureteral STENT PLACEMENT;  Surgeon: Sebastian Ache, MD;  Location: [facility];  Service: Urology;  Laterality: Left;  . HOLMIUM LASER APPLICATION Left 04/12/2015   Procedure: HOLMIUM LASER APPLICATION;  Surgeon: Sebastian Ache, MD;  Location: [facility];  Service: Urology;  Laterality: Left;  . TUBAL LIGATION     Social History   Occupational History  . Not on file  Tobacco Use  . Smoking status: Never Smoker  . Smokeless tobacco: Never Used  Substance and Sexual Activity  . Alcohol use: No  . Drug use: No  . Sexual activity: Not on file

## 2020-04-27 DIAGNOSIS — G8929 Other chronic pain: Secondary | ICD-10-CM | POA: Diagnosis not present

## 2020-04-27 DIAGNOSIS — Z79899 Other long term (current) drug therapy: Secondary | ICD-10-CM | POA: Diagnosis not present

## 2020-04-27 DIAGNOSIS — R635 Abnormal weight gain: Secondary | ICD-10-CM | POA: Diagnosis not present

## 2020-04-27 DIAGNOSIS — R5383 Other fatigue: Secondary | ICD-10-CM | POA: Diagnosis not present

## 2020-04-27 DIAGNOSIS — M25562 Pain in left knee: Secondary | ICD-10-CM | POA: Diagnosis not present

## 2020-07-19 ENCOUNTER — Telehealth: Payer: Self-pay

## 2020-07-19 ENCOUNTER — Ambulatory Visit: Payer: Federal, State, Local not specified - PPO | Admitting: Orthopedic Surgery

## 2020-07-19 DIAGNOSIS — M1712 Unilateral primary osteoarthritis, left knee: Secondary | ICD-10-CM | POA: Diagnosis not present

## 2020-07-19 DIAGNOSIS — M1711 Unilateral primary osteoarthritis, right knee: Secondary | ICD-10-CM | POA: Diagnosis not present

## 2020-07-19 MED ORDER — DICLOFENAC SODIUM 75 MG PO TBEC: DELAYED_RELEASE_TABLET | ORAL | 0 refills | Status: DC

## 2020-07-19 NOTE — Telephone Encounter (Signed)
Can we get bilat knee gel injection auth?

## 2020-07-20 NOTE — Telephone Encounter (Signed)
Noted  

## 2020-07-22 ENCOUNTER — Encounter: Payer: Self-pay | Admitting: Orthopedic Surgery

## 2020-07-22 NOTE — Progress Notes (Unsigned)
Office Visit Note   Patient: Colleen Kemp           Date of Birth: 1966-03-09           MRN: [id number] Visit Date: 07/19/2020 Requested by: Arnette Felts, FNP [address] [place],  [place] [address] PCP: Arnette Felts, FNP  Subjective: Chief Complaint  Patient presents with  . Left Knee - Pain  . Right Knee - Pain    HPI: Colleen Kemp is a 55 y.o. female who presents to the office complaining of bilateral knee pain.  Patient has history of bilateral knee arthritis.  She has had previous Durolane injections and cortisone injections.  Gel injections provide about the same relief as cortisone injections in both last about 3 months.  Localizes most of her pain to the medial aspect of both knees.  Left knee bothers her more than her right knee.  Pain does not wake her up at night.  She does have history of diabetes with last A1c 6.4.Marland Kitchen                ROS: All systems reviewed are negative as they relate to the chief complaint within the history of present illness.  Patient denies fevers or chills.  Assessment & Plan: Visit Diagnoses: No diagnosis found.  Plan: Patient is a 54 year old female who presents complaint of bilateral knee pain.  She has history of bilateral knee osteoarthritis and requests knee injections today.  Most injections last about 3 months for her.  She has had previous steroid injections which also last about 3 months.  Plan to administer bilateral knee cortisone injections today.  She tolerated the procedure well.  Plan to preapproved patient for gel injections and she will follow-up with the office when her cortisone injections wear off.  Prescribed diclofenac to take daily as needed.  This patient is diagnosed with osteoarthritis of the knee(s).    Radiographs show evidence of joint space narrowing, osteophytes, subchondral sclerosis and/or subchondral cysts.  This patient has knee pain which interferes with functional and activities of daily  living.    This patient has experienced inadequate response, adverse effects and/or intolerance with conservative treatments such as acetaminophen, NSAIDS, topical creams, physical therapy or regular exercise, knee bracing and/or weight loss.   This patient has experienced inadequate response or has a contraindication to intra articular steroid injections for at least 3 months.   This patient is not scheduled to have a total knee replacement within 6 months of starting treatment with viscosupplementation.   Follow-Up Instructions: No follow-ups on file.   Orders:  No orders of the defined types were placed in this encounter.  Meds ordered this encounter  Medications  . diclofenac (VOLTAREN) 75 MG EC tablet    Sig: 1 po q d prn    Dispense:  60 tablet    Refill:  0      Procedures: Large Joint Inj: bilateral knee on 07/19/2020 4:30 PM Indications: diagnostic evaluation, joint swelling and pain Details: 18 G 1.5 in needle, superolateral approach  Arthrogram: No  Medications (Right): 5 mL lidocaine 1 %; 4 mL bupivacaine 0.25 %; 6 mg betamethasone acetate-betamethasone sodium phosphate 6 (3-3) MG/ML Medications (Left): 5 mL lidocaine 1 %; 4 mL bupivacaine 0.25 %; 6 mg betamethasone acetate-betamethasone sodium phosphate 6 (3-3) MG/ML Outcome: tolerated well, no immediate complications Procedure, treatment alternatives, risks and benefits explained, specific risks discussed. Consent was given by the patient. Immediately prior to procedure a time  out was called to verify the correct patient, procedure, equipment, support staff and site/side marked as required. Patient was prepped and draped in the usual sterile fashion.       Clinical Data: No additional findings.  Objective: Vital Signs: There were no vitals taken for this visit.  Physical Exam:  Constitutional: Patient appears well-developed HEENT:  Head: Normocephalic Eyes:EOM are normal Neck: Normal range of  motion Cardiovascular: Normal rate Pulmonary/chest: Effort normal Neurologic: Patient is alert Skin: Skin is warm Psychiatric: Patient has normal mood and affect  Ortho Exam: Ortho exam demonstrates bilateral knees without effusion.  No tenderness over the patella bilaterally.  She does have tenderness over the medial and lateral joint lines, more so over the medial.  No calf tenderness.  Negative Homans' sign.  No pain with hip range of motion.  Specialty Comments:  No specialty comments available.  Imaging: No results found.   PMFS History: Patient Active Problem List   Diagnosis Date Noted  . Osteoarthritis of both knees 12/20/2013   Past Medical History:  Diagnosis Date  . Arthritis    oa both knees  . GERD (gastroesophageal reflux disease)   . Hypertension   . Sleep apnea    not gotten cpap yet  . Thyroid disease     Family History  Problem Relation Age of Onset  . Hypertension Mother   . Diabetes Mother   . Hypertension Father     Past Surgical History:  Procedure Laterality Date  . ABDOMINAL HYSTERECTOMY     partial  . BREAST LUMPECTOMY     cyst  . CYSTOSCOPY WITH RETROGRADE PYELOGRAM, URETEROSCOPY AND STENT PLACEMENT Left 04/12/2015   Procedure: CYSTOSCOPY WITH Left RETROGRADE PYELOGRAM, Left URETEROSCOPY , basket stone extraction AND Left Ureteral STENT PLACEMENT;  Surgeon: Sebastian Ache, MD;  Location: [facility];  Service: Urology;  Laterality: Left;  . HOLMIUM LASER APPLICATION Left 04/12/2015   Procedure: HOLMIUM LASER APPLICATION;  Surgeon: Sebastian Ache, MD;  Location: [facility];  Service: Urology;  Laterality: Left;  . TUBAL LIGATION     Social History   Occupational History  . Not on file  Tobacco Use  . Smoking status: Never Smoker  . Smokeless tobacco: Never Used  Substance and Sexual Activity  . Alcohol use: No  . Drug use: No  . Sexual activity: Not on file

## 2020-07-23 MED ORDER — BUPIVACAINE HCL 0.25 % IJ SOLN
4.0000 mL | INTRAMUSCULAR | Status: AC | PRN
  Administered 2020-07-19: 4 mL via INTRA_ARTICULAR

## 2020-07-23 MED ORDER — BETAMETHASONE SOD PHOS & ACET 6 (3-3) MG/ML IJ SUSP
6.0000 mg | INTRAMUSCULAR | Status: AC | PRN
  Administered 2020-07-19: 6 mg via INTRA_ARTICULAR

## 2020-07-23 MED ORDER — LIDOCAINE HCL 1 % IJ SOLN
5.0000 mL | INTRAMUSCULAR | Status: AC | PRN
  Administered 2020-07-19: 5 mL

## 2020-08-16 ENCOUNTER — Telehealth: Payer: Self-pay

## 2020-08-16 NOTE — Telephone Encounter (Signed)
VOB has been submitted for SynviscOne, bilateral knee. Pending BV. 

## 2020-08-24 ENCOUNTER — Other Ambulatory Visit: Payer: Self-pay

## 2020-08-24 ENCOUNTER — Telehealth: Payer: Self-pay

## 2020-08-24 NOTE — Telephone Encounter (Addendum)
Approved for SynviscOne, bilateral knee. Buy & Bill Patient will be responsible for 30% OOP. Co-pay of $40.00 No PA required

## 2020-09-01 ENCOUNTER — Telehealth: Payer: Self-pay | Admitting: Orthopedic Surgery

## 2020-09-01 NOTE — Telephone Encounter (Signed)
Received call from Rikia with [employer] advised patient's co-ay for the gel injection is incorrect. Andree Elk said the copay is $40.00 and not $150.00. Per Rikia the surgical copay is $150.00 and the non surgical copay is $40.00, The number to contact Rikia is [phone]   Ref# is [id number]

## 2020-09-04 NOTE — Telephone Encounter (Signed)
Talked with Shelbie Proctor and for CPT code 70350, co-pay will be $40.00.   Reference# [id number]  09/04/2020

## 2020-10-19 ENCOUNTER — Other Ambulatory Visit: Payer: Self-pay | Admitting: Orthopedic Surgery

## 2020-10-19 ENCOUNTER — Ambulatory Visit: Payer: Federal, State, Local not specified - PPO | Admitting: Orthopedic Surgery

## 2020-10-19 ENCOUNTER — Encounter: Payer: Self-pay | Admitting: Orthopedic Surgery

## 2020-10-19 DIAGNOSIS — M1711 Unilateral primary osteoarthritis, right knee: Secondary | ICD-10-CM | POA: Diagnosis not present

## 2020-10-19 DIAGNOSIS — M1712 Unilateral primary osteoarthritis, left knee: Secondary | ICD-10-CM

## 2020-10-19 MED ORDER — LIDOCAINE HCL 1 % IJ SOLN
5.0000 mL | INTRAMUSCULAR | Status: AC | PRN
  Administered 2020-10-19: 5 mL

## 2020-10-19 MED ORDER — HYLAN G-F 20 48 MG/6ML IX SOSY
48.0000 mg | PREFILLED_SYRINGE | INTRA_ARTICULAR | Status: AC | PRN
Start: 2020-10-19 — End: 2020-10-19
  Administered 2020-10-19: 48 mg via INTRA_ARTICULAR

## 2020-10-19 MED ORDER — HYLAN G-F 20 48 MG/6ML IX SOSY
48.0000 mg | PREFILLED_SYRINGE | INTRA_ARTICULAR | Status: AC | PRN
  Administered 2020-10-19: 48 mg via INTRA_ARTICULAR

## 2020-10-19 NOTE — Progress Notes (Signed)
   Procedure Note  Patient: Colleen Kemp             Date of Birth: 1966/03/22           MRN: [id number]             Visit Date: 10/19/2020  Procedures: Visit Diagnoses:  1. Unilateral primary osteoarthritis, left knee   2. Unilateral primary osteoarthritis, right knee     Large Joint Inj: R knee on 10/19/2020 9:09 AM Indications: pain, joint swelling and diagnostic evaluation Details: 18 G 1.5 in needle, superolateral approach  Arthrogram: No  Medications: 5 mL lidocaine 1 %; 48 mg Hylan 48 MG/6ML Outcome: tolerated well, no immediate complications Procedure, treatment alternatives, risks and benefits explained, specific risks discussed. Consent was given by the patient. Immediately prior to procedure a time out was called to verify the correct patient, procedure, equipment, support staff and site/side marked as required. Patient was prepped and draped in the usual sterile fashion.    Large Joint Inj: L knee on 10/19/2020 9:09 AM Indications: pain, joint swelling and diagnostic evaluation Details: 18 G 1.5 in needle, superolateral approach  Arthrogram: No  Medications: 5 mL lidocaine 1 %; 48 mg Hylan 48 MG/6ML Outcome: tolerated well, no immediate complications Procedure, treatment alternatives, risks and benefits explained, specific risks discussed. Consent was given by the patient. Immediately prior to procedure a time out was called to verify the correct patient, procedure, equipment, support staff and site/side marked as required. Patient was prepped and draped in the usual sterile fashion.

## 2020-10-20 ENCOUNTER — Telehealth: Payer: Self-pay

## 2020-10-20 NOTE — Telephone Encounter (Signed)
Pt called and stated she had the gel injections yesterday and is having a lot of pain today. She stated it is hard to put weight down onto her knees. I advised the pt that is can be normal and to try to move and ice the knees today. She asked I still send a message to Dr. August Saucer to make sure nothing else should be done. Please advise

## 2020-10-23 ENCOUNTER — Ambulatory Visit: Payer: Federal, State, Local not specified - PPO | Admitting: Orthopedic Surgery

## 2020-10-23 ENCOUNTER — Telehealth: Payer: Self-pay | Admitting: Orthopaedic Surgery

## 2020-10-23 DIAGNOSIS — M1712 Unilateral primary osteoarthritis, left knee: Secondary | ICD-10-CM

## 2020-10-23 DIAGNOSIS — M1711 Unilateral primary osteoarthritis, right knee: Secondary | ICD-10-CM | POA: Diagnosis not present

## 2020-10-23 NOTE — Telephone Encounter (Signed)
IC. No better since Friday. Unable to WTB without severe pain- Worked her  in to see Dr August Saucer this afternoon.

## 2020-10-23 NOTE — Telephone Encounter (Signed)
Pt called and states she is in a lot of pain and her knee is still very swollen. She said she can barley walk and her weekend was horrible due to the amount of pain she was having. She needs someone to call her.   CB [phone]

## 2020-10-24 ENCOUNTER — Telehealth: Payer: Self-pay

## 2020-10-24 NOTE — Telephone Encounter (Signed)
Patient called she stated her knee is back swollen and she cant bend them patient is requesting a call [phone]

## 2020-10-25 ENCOUNTER — Ambulatory Visit: Payer: Federal, State, Local not specified - PPO | Admitting: Orthopedic Surgery

## 2020-10-25 DIAGNOSIS — M1712 Unilateral primary osteoarthritis, left knee: Secondary | ICD-10-CM | POA: Diagnosis not present

## 2020-10-25 DIAGNOSIS — M1711 Unilateral primary osteoarthritis, right knee: Secondary | ICD-10-CM | POA: Diagnosis not present

## 2020-10-25 DIAGNOSIS — M25462 Effusion, left knee: Secondary | ICD-10-CM

## 2020-10-25 DIAGNOSIS — M25461 Effusion, right knee: Secondary | ICD-10-CM

## 2020-10-25 NOTE — Telephone Encounter (Signed)
Scheduled paitent as a work in this afternoon per Dr August Saucer to asp knee

## 2020-10-26 ENCOUNTER — Telehealth: Payer: Self-pay

## 2020-10-26 NOTE — Telephone Encounter (Signed)
Pt called stating that the fluid is back on her knee and she would like to know what the next steps are.

## 2020-10-27 ENCOUNTER — Other Ambulatory Visit: Payer: Self-pay | Admitting: Surgical

## 2020-10-27 MED ORDER — PREDNISONE 5 MG (21) PO TBPK: ORAL_TABLET | ORAL | 0 refills | Status: DC

## 2020-10-27 NOTE — Telephone Encounter (Signed)
Sent in rx for steroid dosepack and called her to inform. Left VM, patient did not pick up

## 2020-10-28 ENCOUNTER — Encounter: Payer: Self-pay | Admitting: Orthopedic Surgery

## 2020-10-28 NOTE — Progress Notes (Signed)
Office Visit Note   Patient: Colleen Kemp           Date of Birth: [birth year]           MRN: [id number] Visit Date: 10/25/2020 Requested by: Arnette Felts, FNP [address] [place],  [place] [address] PCP: Arnette Felts, FNP  Subjective: Chief Complaint  Patient presents with   Right Knee - Pain   Left Knee - Pain    HPI: Colleen Kemp is a 55 y.o. female who presents to the office complaining of recurrent bilateral knee pain and swelling.  Patient had inflammatory reaction to gel injections with return on 6/24 bilateral knee aspiration and injection with Toradol.  She reports that she has had both knees with recurrence of swelling.  No new injury.  Pain is slightly improved but still causes her significant distress.  She denies any fevers, chills, night sweats, malaise, draining sinus..  She denies any systemic symptoms of illness and denies any other joint complaints.              ROS: All systems reviewed are negative as they relate to the chief complaint within the history of present illness.  Patient denies fevers or chills.  Assessment & Plan: Visit Diagnoses:  1. Unilateral primary osteoarthritis, right knee   2. Effusion, right knee   3. Unilateral primary osteoarthritis, left knee   4. Effusion, left knee     Plan: Patient is a 55 year old female who returns with recurrence of bilateral knee effusions following gel injections.  She had Toradol injection with aspiration on 6/20 but this only moderately helped her pain.  She has no signs of infection or any exam findings suggestive of septic arthritis but with recurrence of swelling and patient's distress, plan to aspirate knees today and send synovial fluid off for analysis.  Patient tolerated the aspiration did well.  About 60 cc was aspirated from each knee.  Fluid did not appear purulent.  Plan to call patient with lab results and decide on further plan.  No personal or family history of gout or  pseudogout either.  Follow-Up Instructions: No follow-ups on file.   Orders:  Orders Placed This Encounter  Procedures   Gram stain   Anaerobic and Aerobic Culture   Gram stain   Anaerobic and Aerobic Culture   Cell count + diff,  w/ cryst-synvl fld   Synovial Fluid Analysis, Complete   Cell count + diff,  w/ cryst-synvl fld   Synovial Fluid Analysis, Complete   No orders of the defined types were placed in this encounter.     Procedures: Large Joint Inj: bilateral knee on 10/25/2020 1:31 PM Indications: diagnostic evaluation, joint swelling and pain Details: 18 G 1.5 in needle, superolateral approach  Arthrogram: No  Medications (Right): 5 mL lidocaine 1 % Aspirate (Right): 60 mL yellow and blood-tinged Medications (Left): 5 mL lidocaine 1 % Aspirate (Left): 60 mL yellow and blood-tinged Outcome: tolerated well, no immediate complications Procedure, treatment alternatives, risks and benefits explained, specific risks discussed. Consent was given by the patient. Immediately prior to procedure a time out was called to verify the correct patient, procedure, equipment, support staff and site/side marked as required. Patient was prepped and draped in the usual sterile fashion.      Clinical Data: No additional findings.  Objective: Vital Signs: There were no vitals taken for this visit.  Physical Exam:  Constitutional: Patient appears well-developed HEENT:  Head: Normocephalic Eyes:EOM are normal Neck:  Normal range of motion Cardiovascular: Normal rate Pulmonary/chest: Effort normal Neurologic: Patient is alert Skin: Skin is warm Psychiatric: Patient has normal mood and affect  Ortho Exam: Ortho exam demonstrates bilateral knees with large effusions.  No significantly increased warmth over either knee.  No erythema or signs of cellulitis over the knees.  No calf tenderness.  Negative Homans' sign.  Tender diffusely throughout both knees.  Able to extend to 0 degrees  and flex greater than 90 degrees  Specialty Comments:  No specialty comments available.  Imaging: No results found.   PMFS History: Patient Active Problem List   Diagnosis Date Noted   Osteoarthritis of both knees 12/20/2013   Past Medical History:  Diagnosis Date   Arthritis    oa both knees   GERD (gastroesophageal reflux disease)    Hypertension    Sleep apnea    not gotten cpap yet   Thyroid disease     Family History  Problem Relation Age of Onset   Hypertension Mother    Diabetes Mother    Hypertension Father     Past Surgical History:  Procedure Laterality Date   ABDOMINAL HYSTERECTOMY     partial   BREAST LUMPECTOMY     cyst   CYSTOSCOPY WITH RETROGRADE PYELOGRAM, URETEROSCOPY AND STENT PLACEMENT Left 04/12/2015   Procedure: CYSTOSCOPY WITH Left RETROGRADE PYELOGRAM, Left URETEROSCOPY , basket stone extraction AND Left Ureteral STENT PLACEMENT;  Surgeon: Sebastian Ache, MD;  Location: [facility];  Service: Urology;  Laterality: Left;   HOLMIUM LASER APPLICATION Left 04/12/2015   Procedure: HOLMIUM LASER APPLICATION;  Surgeon: Sebastian Ache, MD;  Location: [facility];  Service: Urology;  Laterality: Left;   TUBAL LIGATION     Social History   Occupational History   Not on file  Tobacco Use   Smoking status: Never   Smokeless tobacco: Never  Substance and Sexual Activity   Alcohol use: No   Drug use: No   Sexual activity: Not on file

## 2020-10-29 ENCOUNTER — Encounter: Payer: Self-pay | Admitting: Orthopedic Surgery

## 2020-10-29 MED ORDER — BUPIVACAINE HCL 0.25 % IJ SOLN
4.0000 mL | INTRAMUSCULAR | Status: AC | PRN
  Administered 2020-10-23: 4 mL via INTRA_ARTICULAR

## 2020-10-29 MED ORDER — LIDOCAINE HCL 1 % IJ SOLN
5.0000 mL | INTRAMUSCULAR | Status: AC | PRN
  Administered 2020-10-23: 5 mL

## 2020-10-29 MED ORDER — LIDOCAINE HCL 1 % IJ SOLN
5.0000 mL | INTRAMUSCULAR | Status: AC | PRN
  Administered 2020-10-25: 5 mL

## 2020-10-29 NOTE — Progress Notes (Signed)
Office Visit Note   Patient: Colleen Kemp           Date of Birth: Sep 11, 1965           MRN: [id number] Visit Date: 10/23/2020 Requested by: Arnette Felts, FNP [address] [place],  [place] [address] PCP: Arnette Felts, FNP  Subjective: Chief Complaint  Patient presents with   Right Knee - Pain   Left Knee - Pain    HPI: Patient presents for evaluation of bilateral knee pain.  She had gel injections last week and has had pain and swelling since then.  Denies any fevers or chills.  States she is unable to bend the knee.  She is had prior gel injections without difficulty.              ROS: All systems reviewed are negative as they relate to the chief complaint within the history of present illness.  Patient denies  fevers or chills.   Assessment & Plan: Visit Diagnoses:  1. Unilateral primary osteoarthritis, left knee   2. Unilateral primary osteoarthritis, right knee     Plan: Impression is bilateral knee effusion following gel injections last week.  Both knees were aspirated today and we obtained about 60 cc each.  Effusion was not purulent.  Blood-tinged.  Plan is out of work tomorrow.  Both knees are aspirated and injected with Toradol to try to diminish the inflammatory response.  She will follow-up as needed but instructed to call if her knees are not improving within several days.  Follow-Up Instructions: Return if symptoms worsen or fail to improve.   Orders:  No orders of the defined types were placed in this encounter.  No orders of the defined types were placed in this encounter.     Procedures: Large Joint Inj: R knee on 10/23/2020 12:29 PM Indications: diagnostic evaluation, joint swelling and pain Details: 18 G 1.5 in needle, superolateral approach  Arthrogram: No  Medications: 5 mL lidocaine 1 %; 4 mL bupivacaine 0.25 % Aspirate: 60 mL serous Outcome: tolerated well, no immediate complications Procedure, treatment alternatives, risks  and benefits explained, specific risks discussed. Consent was given by the patient. Immediately prior to procedure a time out was called to verify the correct patient, procedure, equipment, support staff and site/side marked as required. Patient was prepped and draped in the usual sterile fashion.    Large Joint Inj: L knee on 10/23/2020 12:30 PM Indications: diagnostic evaluation, joint swelling and pain Details: 18 G 1.5 in needle, superolateral approach  Arthrogram: No  Medications: 5 mL lidocaine 1 %; 4 mL bupivacaine 0.25 % Aspirate: 60 mL serous Outcome: tolerated well, no immediate complications Procedure, treatment alternatives, risks and benefits explained, specific risks discussed. Consent was given by the patient. Immediately prior to procedure a time out was called to verify the correct patient, procedure, equipment, support staff and site/side marked as required. Patient was prepped and draped in the usual sterile fashion.   1 cc Toradol injected bilaterally along with 4 cc of bupivacaine   Clinical Data: No additional findings.  Objective: Vital Signs: There were no vitals taken for this visit.  Physical Exam: A   Constitutional: Patient appears well-developed HEENT:  Head: Normocephalic Eyes:EOM are normal Neck: Normal range of motion Cardiovascular: Normal rate Pulmonary/chest: Effort normal Neurologic: Patient is alert Skin: Skin is warm Psychiatric: Patient has normal mood and affect   Ortho Exam: Ortho exam demonstrates moderate bilateral knee effusions with intact extensor mechanism.  No proximal lymphadenopathy.  Pedal pulses palpable.  Ankle dorsiflexion intact.  Range of motion is about 0 to 100 degrees.  Collateral crucial ligaments are stable.  No patellofemoral crepitus.  Specialty Comments:  No specialty comments available.  Imaging: No results found.   PMFS History: Patient Active Problem List   Diagnosis Date Noted   Osteoarthritis of both  knees 12/20/2013   Past Medical History:  Diagnosis Date   Arthritis    oa both knees   GERD (gastroesophageal reflux disease)    Hypertension    Sleep apnea    not gotten cpap yet   Thyroid disease     Family History  Problem Relation Age of Onset   Hypertension Mother    Diabetes Mother    Hypertension Father     Past Surgical History:  Procedure Laterality Date   ABDOMINAL HYSTERECTOMY     partial   BREAST LUMPECTOMY     cyst   CYSTOSCOPY WITH RETROGRADE PYELOGRAM, URETEROSCOPY AND STENT PLACEMENT Left 04/12/2015   Procedure: CYSTOSCOPY WITH Left RETROGRADE PYELOGRAM, Left URETEROSCOPY , basket stone extraction AND Left Ureteral STENT PLACEMENT;  Surgeon: Sebastian Ache, MD;  Location: [facility];  Service: Urology;  Laterality: Left;   HOLMIUM LASER APPLICATION Left 04/12/2015   Procedure: HOLMIUM LASER APPLICATION;  Surgeon: Sebastian Ache, MD;  Location: [facility];  Service: Urology;  Laterality: Left;   TUBAL LIGATION     Social History   Occupational History   Not on file  Tobacco Use   Smoking status: Never   Smokeless tobacco: Never  Substance and Sexual Activity   Alcohol use: No   Drug use: No   Sexual activity: Not on file

## 2020-10-30 NOTE — Telephone Encounter (Signed)
I checked this weekend but did not find out the status on the cell count for the cultures on the fluid sent from her knees can you guys look for that and let me know what it showed.  Thanks

## 2020-10-31 LAB — SYNOVIAL FLUID ANALYSIS, COMPLETE
Basophils, %: 0 %
Eosinophils-Synovial: 0 % (ref 0–2)
Lymphocytes-Synovial Fld: 16 % (ref 0–74)
Monocyte/Macrophage: 3 % (ref 0–69)
Neutrophil, Synovial: 81 % — ABNORMAL HIGH (ref 0–24)
Synoviocytes, %: 0 % (ref 0–15)
WBC, Synovial: 2830 cells/uL — ABNORMAL HIGH (ref <150)

## 2020-10-31 LAB — ANAEROBIC AND AEROBIC CULTURE
AER RESULT:: NO GROWTH
MICRO NUMBER:: 12037876
MICRO NUMBER:: 12037877
SPECIMEN QUALITY:: ADEQUATE
SPECIMEN QUALITY:: ADEQUATE

## 2020-10-31 NOTE — Telephone Encounter (Signed)
Saw it thx

## 2020-11-01 LAB — SYNOVIAL FLUID ANALYSIS, COMPLETE
Basophils, %: 0 %
Eosinophils-Synovial: 0 % (ref 0–2)
Lymphocytes-Synovial Fld: 3 % (ref 0–74)
Monocyte/Macrophage: 9 % (ref 0–69)
Neutrophil, Synovial: 88 % — ABNORMAL HIGH (ref 0–24)
Synoviocytes, %: 0 % (ref 0–15)
WBC, Synovial: 16235 cells/uL — ABNORMAL HIGH (ref <150)

## 2020-11-01 LAB — ANAEROBIC AND AEROBIC CULTURE
AER RESULT:: NO GROWTH
MICRO NUMBER:: 12037749
MICRO NUMBER:: 12037750
SPECIMEN QUALITY:: ADEQUATE
SPECIMEN QUALITY:: ADEQUATE

## 2020-11-01 NOTE — Progress Notes (Signed)
I called.  No evidence of infection but it looks like she had inflammatory reaction.  She is essentially back to normal on steroid Dosepak.

## 2021-01-19 ENCOUNTER — Other Ambulatory Visit: Payer: Self-pay

## 2021-01-19 ENCOUNTER — Ambulatory Visit: Payer: Federal, State, Local not specified - PPO | Admitting: Orthopedic Surgery

## 2021-01-19 DIAGNOSIS — M1711 Unilateral primary osteoarthritis, right knee: Secondary | ICD-10-CM | POA: Diagnosis not present

## 2021-01-19 DIAGNOSIS — M1712 Unilateral primary osteoarthritis, left knee: Secondary | ICD-10-CM | POA: Diagnosis not present

## 2021-01-21 ENCOUNTER — Encounter: Payer: Self-pay | Admitting: Orthopedic Surgery

## 2021-01-21 NOTE — Progress Notes (Signed)
Office Visit Note   Patient: Colleen Kemp           Date of Birth: December 10, 1965           MRN: [id number] Visit Date: 01/19/2021 Requested by: Arnette Felts, FNP [address] [place],  [place] [address] PCP: Truett Perna, MD  Subjective: Chief Complaint  Patient presents with   Left Knee - Pain   Right Knee - Pain    HPI: Patient presents with bilateral knee pain left worse than right.  She had an injection 10/25/2020.  Reacted to the gel at that time.  Required Medrol Dosepak.  Diclofenac has not been helpful and tramadol has not been helpful.  She has been taking some Omega XL which has been helpful some.  She states however in general she is miserable.  She states "I have got to do something".  She does work at the [employer] and stands on concrete or hard floors during the day.  No prior surgery to the left knee.  Her apartment has 12 steps.  Hemoglobin A1c under good control at 6.0.  Has hypertension but no personal or family history of DVT or pulmonary embolism.              ROS: All systems reviewed are negative as they relate to the chief complaint within the history of present illness.  Patient denies  fevers or chills.   Assessment & Plan: Visit Diagnoses:  1. Unilateral primary osteoarthritis, right knee   2. Unilateral primary osteoarthritis, left knee     Plan: Impression is end-stage arthritis left knee.  She is not really able to walk more than about 200 feet without having to stop.  Handicap placard provided today for 6 months.  Continue with nonweightbearing quad strengthening exercises plus over-the-counter medication or supplements which she feels can help.  I think she is heading for left knee replacement.  Injections of the conventional variety have not been particularly effective.  She is going to consider her options.  Did discuss with her today some of the risk and benefits of knee replacement including not limited to infection nerve vessel damage the  rather grueling rehabilitative process along with implant longevity.  Patient understands risk and benefits.  Debbie's card is provided.  Follow-up as needed.  Follow-Up Instructions: Return if symptoms worsen or fail to improve.   Orders:  No orders of the defined types were placed in this encounter.  No orders of the defined types were placed in this encounter.     Procedures: No procedures performed   Clinical Data: No additional findings.  Objective: Vital Signs: There were no vitals taken for this visit.  Physical Exam:   Constitutional: Patient appears well-developed HEENT:  Head: Normocephalic Eyes:EOM are normal Neck: Normal range of motion Cardiovascular: Normal rate Pulmonary/chest: Effort normal Neurologic: Patient is alert Skin: Skin is warm Psychiatric: Patient has normal mood and affect   Ortho Exam: Ortho exam demonstrates slightly antalgic gait to the left.  No effusion in the left knee.  Collateral and cruciate ligaments are stable.  No groin pain with internal ex rotation on the left-hand side.  Trace effusion in the left knee.  Range of motion unchanged.  Extensor mechanism intact.  Patient has medial and lateral joint line tenderness.  Specialty Comments:  No specialty comments available.  Imaging: No results found.   PMFS History: Patient Active Problem List   Diagnosis Date Noted   Osteoarthritis of both knees  12/20/2013   Past Medical History:  Diagnosis Date   Arthritis    oa both knees   GERD (gastroesophageal reflux disease)    Hypertension    Sleep apnea    not gotten cpap yet   Thyroid disease     Family History  Problem Relation Age of Onset   Hypertension Mother    Diabetes Mother    Hypertension Father     Past Surgical History:  Procedure Laterality Date   ABDOMINAL HYSTERECTOMY     partial   BREAST LUMPECTOMY     cyst   CYSTOSCOPY WITH RETROGRADE PYELOGRAM, URETEROSCOPY AND STENT PLACEMENT Left 04/12/2015    Procedure: CYSTOSCOPY WITH Left RETROGRADE PYELOGRAM, Left URETEROSCOPY , basket stone extraction AND Left Ureteral STENT PLACEMENT;  Surgeon: Sebastian Ache, MD;  Location: [facility];  Service: Urology;  Laterality: Left;   HOLMIUM LASER APPLICATION Left 04/12/2015   Procedure: HOLMIUM LASER APPLICATION;  Surgeon: Sebastian Ache, MD;  Location: [facility];  Service: Urology;  Laterality: Left;   TUBAL LIGATION     Social History   Occupational History   Not on file  Tobacco Use   Smoking status: Never   Smokeless tobacco: Never  Substance and Sexual Activity   Alcohol use: No   Drug use: No   Sexual activity: Not on file

## 2021-01-23 IMAGING — DX PORTABLE CHEST - 1 VIEW
1 series · 1 of 1 positions shown · non-contrast
Comparison: No prior.

CLINICAL DATA: Cough.

EXAM:
PORTABLE CHEST 1 VIEW

[chest ap]
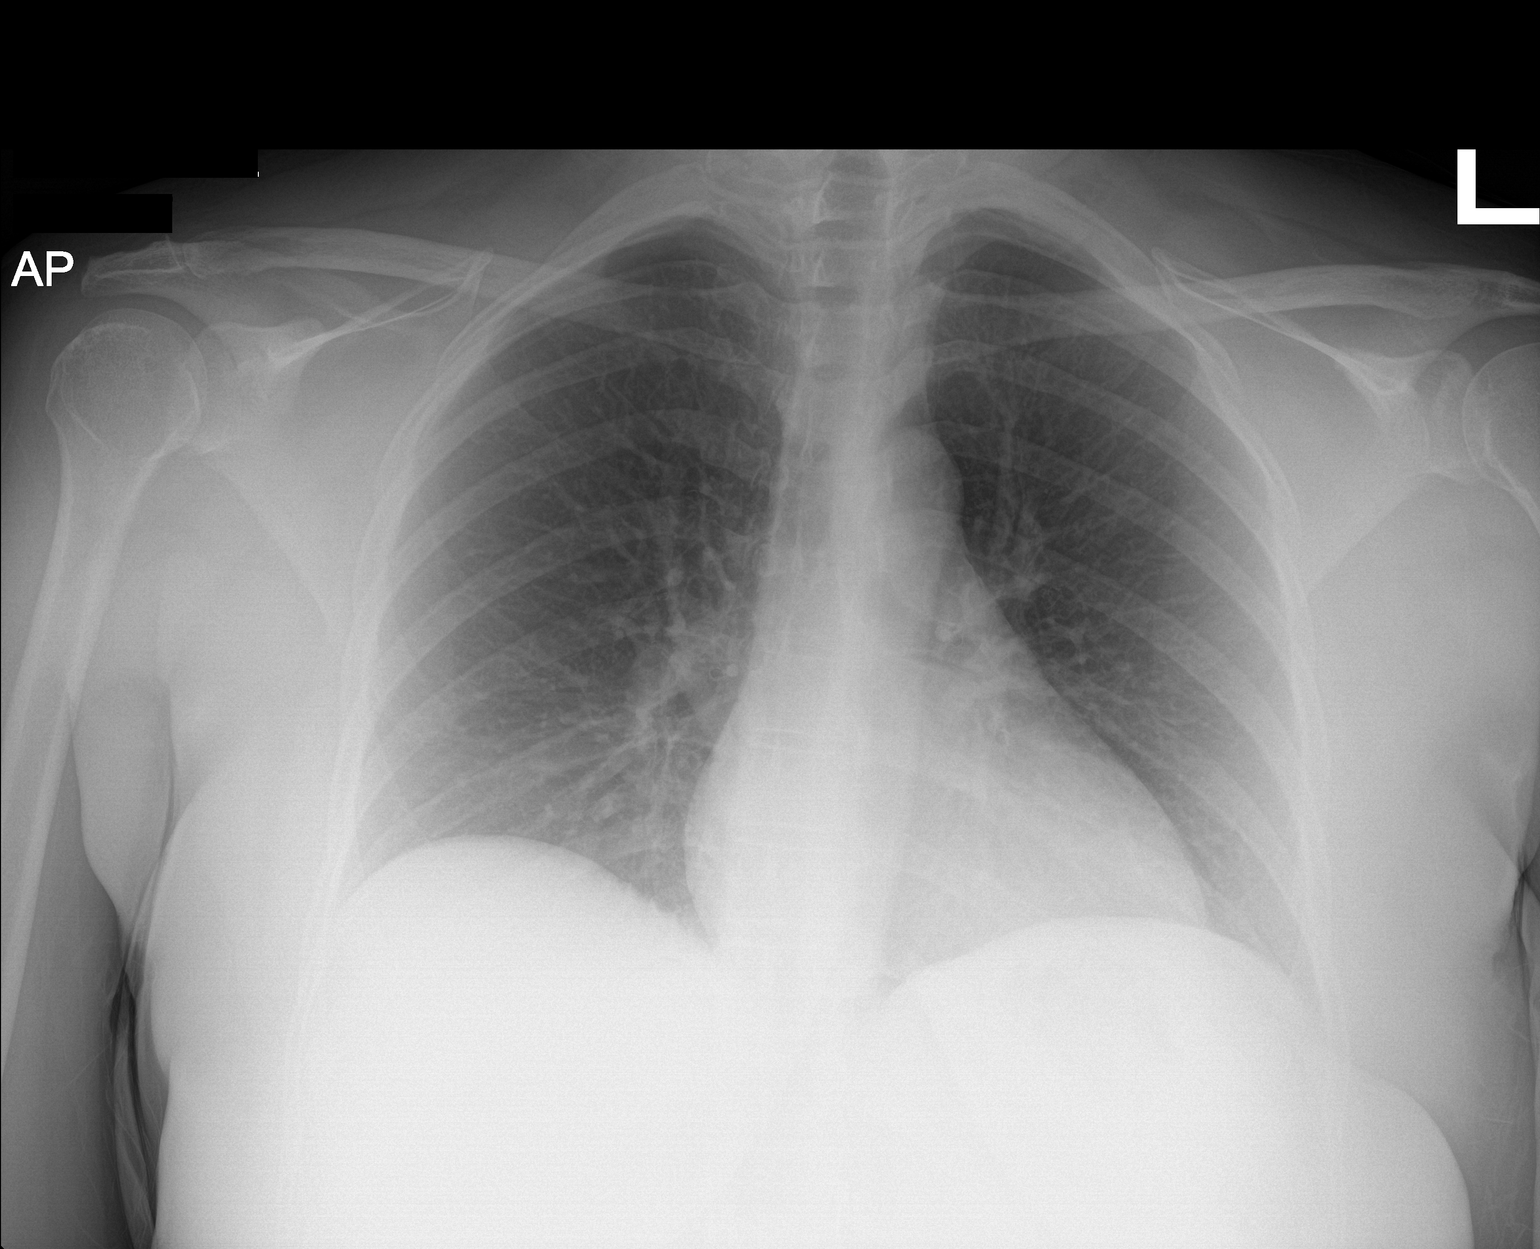

[1 of 1 positions shown; findings below may reference images not displayed]

FINDINGS: Mediastinum and hilar structures normal. Lungs are clear. No pleural
effusion or pneumothorax. Heart size normal. No acute bony
abnormality.
IMPRESSION: No acute cardiopulmonary disease.

## 2021-03-26 ENCOUNTER — Telehealth: Payer: Self-pay

## 2021-03-26 NOTE — Telephone Encounter (Signed)
Pt called into the office stating she would like something for pain she has an apt on dec 7th for an injection. She stated that the Prednisone did help

## 2021-03-27 ENCOUNTER — Other Ambulatory Visit: Payer: Self-pay | Admitting: Surgical

## 2021-03-27 MED ORDER — DICLOFENAC SODIUM 75 MG PO TBEC: 75.0000 mg | DELAYED_RELEASE_TABLET | Freq: Two times a day (BID) | ORAL | 0 refills | Status: DC

## 2021-03-27 NOTE — Telephone Encounter (Signed)
Refilled diclofenac. Could try one time Tramadol RX if that doesn't help

## 2021-03-27 NOTE — Telephone Encounter (Signed)
Pt advised.

## 2021-04-11 ENCOUNTER — Other Ambulatory Visit: Payer: Self-pay

## 2021-04-11 ENCOUNTER — Ambulatory Visit: Payer: Federal, State, Local not specified - PPO | Admitting: Surgical

## 2021-04-11 ENCOUNTER — Encounter: Payer: Self-pay | Admitting: Orthopedic Surgery

## 2021-04-11 DIAGNOSIS — M1712 Unilateral primary osteoarthritis, left knee: Secondary | ICD-10-CM

## 2021-04-11 DIAGNOSIS — M1711 Unilateral primary osteoarthritis, right knee: Secondary | ICD-10-CM | POA: Diagnosis not present

## 2021-04-11 MED ORDER — LIDOCAINE HCL 1 % IJ SOLN
5.0000 mL | INTRAMUSCULAR | Status: AC | PRN
  Administered 2021-04-11: 5 mL

## 2021-04-11 MED ORDER — METHYLPREDNISOLONE ACETATE 40 MG/ML IJ SUSP
40.0000 mg | INTRAMUSCULAR | Status: AC | PRN
Start: 2021-04-11 — End: 2021-04-11
  Administered 2021-04-11: 40 mg via INTRA_ARTICULAR

## 2021-04-11 MED ORDER — BUPIVACAINE HCL 0.25 % IJ SOLN
4.0000 mL | INTRAMUSCULAR | Status: AC | PRN
  Administered 2021-04-11: 4 mL via INTRA_ARTICULAR

## 2021-04-11 MED ORDER — METHYLPREDNISOLONE ACETATE 40 MG/ML IJ SUSP
40.0000 mg | INTRAMUSCULAR | Status: AC | PRN
  Administered 2021-04-11: 40 mg via INTRA_ARTICULAR

## 2021-04-11 MED ORDER — BUPIVACAINE HCL 0.25 % IJ SOLN
4.0000 mL | INTRAMUSCULAR | Status: AC | PRN
Start: 2021-04-11 — End: 2021-04-11
  Administered 2021-04-11: 4 mL via INTRA_ARTICULAR

## 2021-04-11 NOTE — Progress Notes (Signed)
Office Visit Note   Patient: Colleen Kemp           Date of Birth: Mar 29, 1966           MRN: [id number] Visit Date: 04/11/2021 Requested by: Truett Perna, MD [id number] [place],  [place] [address] PCP: Truett Perna, MD  Subjective: Chief Complaint  Patient presents with   Right Knee - Pain   Left Knee - Pain    HPI: Colleen Kemp is a 55 y.o. female who presents to the office complaining of bilateral knee pain.  Patient returns for evaluation of knee pain.  Left knee bothers her more than her right.  She complains of severe pain that has been worse since her last visit several months ago.  No new injury.  Knee is giving out on her more frequently.  Most of her pain is medial but she does have occasional lateral pain in the right knee.  She is waking with pain due to her knee pain which is new for her.  She has about 2-minute walking endurance.  Takes occasional diclofenac.  Denies any history of surgery in the knee, groin pain, low back pain, radicular pain.  Denies any history of DVT or PE but she does note that her dad takes blood thinners though she is unsure why this is.  She has had previous reaction to gel injections.                ROS: All systems reviewed are negative as they relate to the chief complaint within the history of present illness.  Patient denies fevers or chills.  Assessment & Plan: Visit Diagnoses:  1. Unilateral primary osteoarthritis, right knee   2. Unilateral primary osteoarthritis, left knee     Plan: Patient is a 55 year old female who presents for repeat evaluation of bilateral knee pain.  She has history of bilateral knee osteoarthritis.  Her pain is becoming more severe to the point where she is considering surgery but does not want this at this time.  She wants to consider surgery for late spring or early summer 2023.  She would like to try cortisone injections today even though they have provided somewhat limited relief for her in the  past.  Pain is been worse since moving so hopefully this will get her back to her baseline level of pain prior to her move.  She tolerated bilateral knee injections well.  Also plan to refer her to physical therapy upstairs for 1 visit to establish a home exercise program for someone with knee arthritis.  She will try and obtain a stationary bike which would be helpful.  Follow-up with the office as needed.  Follow-Up Instructions: No follow-ups on file.   Orders:  Orders Placed This Encounter  Procedures   Ambulatory referral to Physical Therapy   No orders of the defined types were placed in this encounter.     Procedures: Large Joint Inj: bilateral knee on 04/11/2021 11:45 AM Indications: diagnostic evaluation, joint swelling and pain Details: 18 G 1.5 in needle, superolateral approach  Arthrogram: No  Medications (Right): 5 mL lidocaine 1 %; 4 mL bupivacaine 0.25 %; 40 mg methylPREDNISolone acetate 40 MG/ML Medications (Left): 5 mL lidocaine 1 %; 4 mL bupivacaine 0.25 %; 40 mg methylPREDNISolone acetate 40 MG/ML Outcome: tolerated well, no immediate complications Procedure, treatment alternatives, risks and benefits explained, specific risks discussed. Consent was given by the patient. Immediately prior to procedure a time out was  called to verify the correct patient, procedure, equipment, support staff and site/side marked as required. Patient was prepped and draped in the usual sterile fashion.      Clinical Data: No additional findings.  Objective: Vital Signs: There were no vitals taken for this visit.  Physical Exam:  Constitutional: Patient appears well-developed HEENT:  Head: Normocephalic Eyes:EOM are normal Neck: Normal range of motion Cardiovascular: Normal rate Pulmonary/chest: Effort normal Neurologic: Patient is alert Skin: Skin is warm Psychiatric: Patient has normal mood and affect  Ortho Exam: Ortho exam demonstrates bilateral knee with trace effusion.   Tenderness over the medial joint line of both knees.  No tenderness over the lateral joint line of the left but mild lateral joint line tenderness of the right knee.  No calf tenderness.  Negative Homans' sign.  Able to actively perform straight leg raise without difficulty.  No pain with hip range of motion.  Specialty Comments:  No specialty comments available.  Imaging: No results found.   PMFS History: Patient Active Problem List   Diagnosis Date Noted   Osteoarthritis of both knees 12/20/2013   Past Medical History:  Diagnosis Date   Arthritis    oa both knees   GERD (gastroesophageal reflux disease)    Hypertension    Sleep apnea    not gotten cpap yet   Thyroid disease     Family History  Problem Relation Age of Onset   Hypertension Mother    Diabetes Mother    Hypertension Father     Past Surgical History:  Procedure Laterality Date   ABDOMINAL HYSTERECTOMY     partial   BREAST LUMPECTOMY     cyst   CYSTOSCOPY WITH RETROGRADE PYELOGRAM, URETEROSCOPY AND STENT PLACEMENT Left 04/12/2015   Procedure: CYSTOSCOPY WITH Left RETROGRADE PYELOGRAM, Left URETEROSCOPY , basket stone extraction AND Left Ureteral STENT PLACEMENT;  Surgeon: Sebastian Ache, MD;  Location: [facility];  Service: Urology;  Laterality: Left;   HOLMIUM LASER APPLICATION Left 04/12/2015   Procedure: HOLMIUM LASER APPLICATION;  Surgeon: Sebastian Ache, MD;  Location: [facility];  Service: Urology;  Laterality: Left;   TUBAL LIGATION     Social History   Occupational History   Not on file  Tobacco Use   Smoking status: Never   Smokeless tobacco: Never  Substance and Sexual Activity   Alcohol use: No   Drug use: No   Sexual activity: Not on file

## 2021-05-09 ENCOUNTER — Ambulatory Visit: Payer: Federal, State, Local not specified - PPO | Admitting: Physical Therapy

## 2021-06-06 ENCOUNTER — Ambulatory Visit: Payer: Federal, State, Local not specified - PPO | Attending: Orthopedic Surgery | Admitting: Physical Therapy

## 2021-06-11 ENCOUNTER — Ambulatory Visit: Payer: Federal, State, Local not specified - PPO

## 2021-06-13 ENCOUNTER — Encounter: Payer: Federal, State, Local not specified - PPO | Admitting: Physical Therapy

## 2021-06-20 ENCOUNTER — Encounter: Payer: Federal, State, Local not specified - PPO | Admitting: Physical Therapy

## 2021-06-28 ENCOUNTER — Encounter: Payer: Federal, State, Local not specified - PPO | Admitting: Physical Therapy

## 2021-07-02 ENCOUNTER — Encounter: Payer: Federal, State, Local not specified - PPO | Admitting: Physical Therapy

## 2021-07-10 ENCOUNTER — Telehealth: Payer: Self-pay | Admitting: Orthopedic Surgery

## 2021-07-10 NOTE — Telephone Encounter (Signed)
Received medical records request form patient ?

## 2021-08-09 NOTE — Telephone Encounter (Signed)
ok 

## 2021-08-21 ENCOUNTER — Telehealth: Payer: Self-pay | Admitting: Orthopedic Surgery

## 2021-08-21 NOTE — Telephone Encounter (Signed)
Called patient left message to call back to schedule an appointment with Dr. August Saucer for a cortisone injection ?

## 2021-09-12 ENCOUNTER — Ambulatory Visit: Payer: Federal, State, Local not specified - PPO | Admitting: Orthopedic Surgery

## 2022-04-11 ENCOUNTER — Ambulatory Visit (INDEPENDENT_AMBULATORY_CARE_PROVIDER_SITE_OTHER): Payer: Federal, State, Local not specified - PPO

## 2022-04-11 ENCOUNTER — Ambulatory Visit: Payer: Federal, State, Local not specified - PPO | Admitting: Orthopedic Surgery

## 2022-04-11 ENCOUNTER — Ambulatory Visit: Payer: Self-pay

## 2022-04-11 DIAGNOSIS — M79642 Pain in left hand: Secondary | ICD-10-CM

## 2022-04-11 DIAGNOSIS — M1711 Unilateral primary osteoarthritis, right knee: Secondary | ICD-10-CM | POA: Diagnosis not present

## 2022-04-11 DIAGNOSIS — M1712 Unilateral primary osteoarthritis, left knee: Secondary | ICD-10-CM

## 2022-04-12 ENCOUNTER — Encounter: Payer: Self-pay | Admitting: Orthopedic Surgery

## 2022-04-12 MED ORDER — METHYLPREDNISOLONE ACETATE 40 MG/ML IJ SUSP
40.0000 mg | INTRAMUSCULAR | Status: AC | PRN
  Administered 2022-04-11: 40 mg via INTRA_ARTICULAR

## 2022-04-12 MED ORDER — LIDOCAINE HCL 1 % IJ SOLN
5.0000 mL | INTRAMUSCULAR | Status: AC | PRN
  Administered 2022-04-11: 5 mL

## 2022-04-12 MED ORDER — BUPIVACAINE HCL 0.25 % IJ SOLN
4.0000 mL | INTRAMUSCULAR | Status: AC | PRN
  Administered 2022-04-11: 4 mL via INTRA_ARTICULAR

## 2022-04-12 NOTE — Progress Notes (Signed)
Office Visit Note   Patient: Colleen Kemp           Date of Birth: 11-03-1965           MRN: [id number] Visit Date: 04/11/2022 Requested by: Truett Perna, MD [address] [place],  [place] [address] PCP: Truett Perna, MD  Subjective: Chief Complaint  Patient presents with   Left Hand - Pain   Other     Bilateral knee pain    HPI: Colleen Kemp is a 56 y.o. female who presents to the office reporting left thumb and hand pain as well as bilateral knee pain.  Patient reports relatively constant pain in the hand without any history of injury.  Started a few months ago.  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  That primarily involves the left hand.  She is right-hand dominant.  States she has good and bad days.  Reports decreased strength and grip.  Does not wake her from sleep at night.  Denies any numbness and tingling in the hand.  Does not take over-the-counter medication for pain.  She does have a history of diabetes and hypertension.  Patient also reports bilateral knee pain.  Left side worse than right.  She would like to have injections today.  Her knee pain is waking her from sleep at night.  Describes some mechanical symptoms of popping stiffness and swelling.  She would like to have surgery on the knees but she may be trying to get this filed under Workmen's Comp.  Left knee hurts worse than the right knee.  Denies any groin pain or back pain..                ROS: All systems reviewed are negative as they relate to the chief complaint within the history of present illness.  Patient denies fevers or chills.  Assessment & Plan: Visit Diagnoses:  1. Unilateral primary osteoarthritis, left knee   2. Unilateral primary osteoarthritis, right knee   3. Pain in left hand     Plan: Impression is left thumb CMC arthritis.  We talked about injection today.  No subluxation of the joint on radiographs.  We are going to try topical as well as CMC brace.  Regarding both  knees she has end-stage arthritis.  In general I think this is involving primarily the medial compartment.  Bilateral knee injections are performed today.  Continue with nonweightbearing quad strengthening exercises and follow-up as needed.  Could also consider gel injections but she is going to hold off on that for now.  Follow-Up Instructions: No follow-ups on file.   Orders:  Orders Placed This Encounter  Procedures   XR Knee 1-2 Views Right   XR KNEE 3 VIEW LEFT   XR Hand Complete Left   No orders of the defined types were placed in this encounter.     Procedures: Large Joint Inj: bilateral knee on 04/11/2022 6:54 AM Indications: diagnostic evaluation, joint swelling and pain Details: 18 G 1.5 in needle, superolateral approach  Arthrogram: No  Medications (Right): 5 mL lidocaine 1 %; 4 mL bupivacaine 0.25 %; 40 mg methylPREDNISolone acetate 40 MG/ML Medications (Left): 5 mL lidocaine 1 %; 4 mL bupivacaine 0.25 %; 40 mg methylPREDNISolone acetate 40 MG/ML Outcome: tolerated well, no immediate complications Procedure, treatment alternatives, risks and benefits explained, specific risks discussed. Consent was given by the patient. Immediately prior to procedure a time out was called to verify the correct patient,  procedure, equipment, support staff and site/side marked as required. Patient was prepped and draped in the usual sterile fashion.       Clinical Data: No additional findings.  Objective: Vital Signs: There were no vitals taken for this visit.  Physical Exam:  Constitutional: Patient appears well-developed HEENT:  Head: Normocephalic Eyes:EOM are normal Neck: Normal range of motion Cardiovascular: Normal rate Pulmonary/chest: Effort normal Neurologic: Patient is alert Skin: Skin is warm Psychiatric: Patient has normal mood and affect  Ortho Exam: Ortho exam demonstrates intact EPL FPL interosseous function on the left.  Positive grind test on the left  negative on the right for CMC arthritis in the thumb.  Full composite flexion and extension of the fingers and thumb.  No paresthesias on the dorsal palmar aspect of the hand.  Has intact EPL FPL tendons.  Bilateral knee exam demonstrates mild varus alignment.  Palpable pedal pulses.  Pretty reasonable range of motion from 0 to about 125 bilaterally.  Collateral crucial ligaments feel stable.  Mild effusion left knee no effusion right knee.  Extensor mechanism intact with patellofemoral crepitus present on both sides which is mild.  Specialty Comments:  No specialty comments available.  Imaging: No results found.   PMFS History: Patient Active Problem List   Diagnosis Date Noted   Osteoarthritis of both knees 12/20/2013   Past Medical History:  Diagnosis Date   Arthritis    oa both knees   GERD (gastroesophageal reflux disease)    Hypertension    Sleep apnea    not gotten cpap yet   Thyroid disease     Family History  Problem Relation Age of Onset   Hypertension Mother    Diabetes Mother    Hypertension Father     Past Surgical History:  Procedure Laterality Date   ABDOMINAL HYSTERECTOMY     partial   BREAST LUMPECTOMY     cyst   CYSTOSCOPY WITH RETROGRADE PYELOGRAM, URETEROSCOPY AND STENT PLACEMENT Left 04/12/2015   Procedure: CYSTOSCOPY WITH Left RETROGRADE PYELOGRAM, Left URETEROSCOPY , basket stone extraction AND Left Ureteral STENT PLACEMENT;  Surgeon: Sebastian Ache, MD;  Location: [facility];  Service: Urology;  Laterality: Left;   HOLMIUM LASER APPLICATION Left 04/12/2015   Procedure: HOLMIUM LASER APPLICATION;  Surgeon: Sebastian Ache, MD;  Location: [facility];  Service: Urology;  Laterality: Left;   TUBAL LIGATION     Social History   Occupational History   Not on file  Tobacco Use   Smoking status: Never   Smokeless tobacco: Never  Substance and Sexual Activity   Alcohol use: No   Drug use: No   Sexual activity: Not on file

## 2022-07-15 ENCOUNTER — Other Ambulatory Visit: Payer: Self-pay | Admitting: Surgical

## 2022-07-15 MED ORDER — DICLOFENAC SODIUM 75 MG PO TBEC: 75.0000 mg | DELAYED_RELEASE_TABLET | Freq: Two times a day (BID) | ORAL | 0 refills | Status: DC

## 2022-07-15 NOTE — Telephone Encounter (Signed)
From: Gardiner Coins Ode To: Office of [place], [place] Sent: 07/15/2022 12:14 PM EDT Subject: Medication Renewal Request  Refills have been requested for the following medications:   diclofenac (VOLTAREN) 75 MG EC tablet [Luke Deandre Stansel]  Patient Comment: I am in a lot of pain and having trouble walking, can you please give me prescription for this to see if it helps me as it is too soon for a cortisone shot. Thank you   Preferred pharmacy: [facility], [place] Delivery method: [employer]

## 2022-08-10 ENCOUNTER — Other Ambulatory Visit: Payer: Self-pay | Admitting: Surgical

## 2022-08-22 ENCOUNTER — Other Ambulatory Visit: Payer: Self-pay | Admitting: Chiropractic Medicine

## 2022-08-22 ENCOUNTER — Ambulatory Visit
Admission: RE | Admit: 2022-08-22 | Discharge: 2022-08-22 | Disposition: A | Discharge status: Home / Self Care | Payer: Federal, State, Local not specified - PPO | Source: Ambulatory Visit | Attending: Chiropractic Medicine | Admitting: Chiropractic Medicine

## 2022-08-22 DIAGNOSIS — M25561 Pain in right knee: Secondary | ICD-10-CM

## 2023-07-17 NOTE — Progress Notes (Signed)
 Surgery orders requested via Epic inbox.

## 2023-07-18 ENCOUNTER — Ambulatory Visit: Payer: Self-pay | Admitting: Emergency Medicine

## 2023-07-18 DIAGNOSIS — G8929 Other chronic pain: Secondary | ICD-10-CM

## 2023-07-18 NOTE — H&P (Signed)
 TOTAL KNEE ADMISSION H&P  Patient is being admitted for right total knee arthroplasty.  Subjective:  Chief Complaint:right knee pain.  HPI: Colleen Kemp, 58 y.o. female, has a history of pain and functional disability in the right knee due to arthritis and has failed non-surgical conservative treatments for greater than 12 weeks to includeNSAID's and/or analgesics, corticosteriod injections, viscosupplementation injections, supervised PT with diminished ADL's post treatment, use of assistive devices, and activity modification.  Onset of symptoms was gradual, starting 10 years ago with gradually worsening course since that time. The patient noted no past surgery on the right knee(s).  Patient currently rates pain in the right knee(s) at 10 out of 10 with activity. Patient has night pain, worsening of pain with activity and weight bearing, pain that interferes with activities of daily living, and pain with passive range of motion.  Patient has evidence of periarticular osteophytes and joint space narrowing by imaging studies.  There is no active infection.  Patient Active Problem List   Diagnosis Date Noted   Osteoarthritis of both knees 12/20/2013   Past Medical History:  Diagnosis Date   Arthritis    oa both knees   GERD (gastroesophageal reflux disease)    Hypertension    Sleep apnea    not gotten cpap yet   Thyroid disease     Past Surgical History:  Procedure Laterality Date   ABDOMINAL HYSTERECTOMY     partial   BREAST LUMPECTOMY     cyst   CYSTOSCOPY WITH RETROGRADE PYELOGRAM, URETEROSCOPY AND STENT PLACEMENT Left 04/12/2015   Procedure: CYSTOSCOPY WITH Left RETROGRADE PYELOGRAM, Left URETEROSCOPY , basket stone extraction AND Left Ureteral STENT PLACEMENT;  Surgeon: Sebastian Ache, MD;  Location: [facility];  Service: Urology;  Laterality: Left;   HOLMIUM LASER APPLICATION Left 04/12/2015   Procedure: HOLMIUM LASER APPLICATION;  Surgeon: Sebastian Ache, MD;  Location: [facility];   Service: Urology;  Laterality: Left;   TUBAL LIGATION      Current Outpatient Medications  Medication Sig Dispense Refill Last Dose/Taking   atorvastatin (LIPITOR) 10 MG tablet Take 1 tablet (10 mg total) by mouth daily. 30 tablet 2    Cholecalciferol 25 MCG (1000 UT) capsule Take by mouth.      Cyanocobalamin 1000 MCG TBCR Take by mouth.      diclofenac (VOLTAREN) 75 MG EC tablet Take 1 tablet by mouth twice daily 60 tablet 0    estradiol (VIVELLE-DOT) 0.025 MG/24HR Place onto the skin.      hydrochlorothiazide (HYDRODIURIL) 25 MG tablet Take 1 tablet (25 mg total) by mouth daily. 90 tablet 1    levofloxacin (LEVAQUIN) 500 MG tablet levofloxacin 500 mg tablet      levothyroxine (SYNTHROID) 125 MCG tablet Take 1 tablet (125 mcg total) by mouth daily. 90 tablet 1    metFORMIN (GLUCOPHAGE) 500 MG tablet Take 1 tablet (500 mg total) by mouth 2 (two) times daily with a meal. 60 tablet 2    Naltrexone-buPROPion HCl ER (CONTRAVE) 8-90 MG TB12 Start 1 tablet every morning for 7 days, then 1 tablet twice daily for 7 days, then 2 tablets every morning and one every evening 84 tablet 0    PARoxetine Mesylate (BRISDELLE) 7.5 MG CAPS [place] 7.5 mg capsule  Take 1 capsule every day by oral route as directed for 7 days.      predniSONE (STERAPRED UNI-PAK 21 TAB) 5 MG (21) TBPK tablet Take as directed on packaging 21 tablet 0    sertraline (ZOLOFT)  50 MG tablet Take 50 mg by mouth daily.      traMADol (ULTRAM) 50 MG tablet tramadol 50 mg tablet      Vitamins/Minerals TABS Take by mouth.      No current facility-administered medications for this visit.   Allergies  Allergen Reactions   Tramadol Nausea And Vomiting    Social History   Tobacco Use   Smoking status: Never   Smokeless tobacco: Never  Substance Use Topics   Alcohol use: No    Family History  Problem Relation Age of Onset   Hypertension Mother    Diabetes Mother    Hypertension Father      Review of Systems  Musculoskeletal:   Positive for arthralgias.  All other systems reviewed and are negative.   Objective:  Physical Exam Constitutional:      General: She is not in acute distress.    Appearance: Normal appearance. She is not ill-appearing.  HENT:     Head: Normocephalic and atraumatic.     Right Ear: External ear normal.     Left Ear: External ear normal.     Nose: Nose normal.     Mouth/Throat:     Mouth: Mucous membranes are moist.     Pharynx: Oropharynx is clear.  Eyes:     Extraocular Movements: Extraocular movements intact.     Conjunctiva/sclera: Conjunctivae normal.  Cardiovascular:     Rate and Rhythm: Normal rate and regular rhythm.     Pulses: Normal pulses.     Heart sounds: Normal heart sounds.  Pulmonary:     Effort: Pulmonary effort is normal.     Breath sounds: Normal breath sounds.  Abdominal:     General: Bowel sounds are normal.     Palpations: Abdomen is soft.     Tenderness: There is no abdominal tenderness.  Musculoskeletal:        General: Tenderness present.     Cervical back: Normal range of motion and neck supple.     Comments: TTP over medial and lateral joint line.  No calf tenderness, swelling, or erythema.  No overlying lesions of area of chief complaint.  Decreased strength and ROM due to elicited pain.  Pre-operative ROM 0-110.  Dorsiflexion and plantarflexion intact.  Stable to varus and valgus stress.  BLE appear grossly neurovascularly intact.  Gait mildly antalgic.   Skin:    General: Skin is warm and dry.  Neurological:     Mental Status: She is alert and oriented to person, place, and time. Mental status is at baseline.  Psychiatric:        Mood and Affect: Mood normal.        Behavior: Behavior normal.     Vital signs in last 24 hours: @VSRANGES @  Labs:   Estimated body mass index is 36.78 kg/m as calculated from the following:   Height as of 08/11/19: 5\' 5"  (1.651 m).   Weight as of 08/11/19: 100.2 kg.   Imaging Review Plain radiographs  demonstrate severe degenerative joint disease of the right knee(s). The bone quality appears to be fair for age and reported activity level.      Assessment/Plan:  End stage arthritis, right knee   The patient history, physical examination, clinical judgment of the provider and imaging studies are consistent with end stage degenerative joint disease of the right knee(s) and total knee arthroplasty is deemed medically necessary. The treatment options including medical management, injection therapy arthroscopy and arthroplasty were discussed at length. The  risks and benefits of total knee arthroplasty were presented and reviewed. The risks due to aseptic loosening, infection, stiffness, patella tracking problems, thromboembolic complications and other imponderables were discussed. The patient acknowledged the explanation, agreed to proceed with the plan and consent was signed. Patient is being admitted for inpatient treatment for surgery, pain control, PT, OT, prophylactic antibiotics, VTE prophylaxis, progressive ambulation and ADL's and discharge planning. The patient is planning to be discharged home with outpatient PT.     Patient's anticipated LOS is less than 2 midnights, meeting these requirements: - Younger than 70 - Lives within 1 hour of care - Has a competent adult at home to recover with post-op recover - NO history of  - Chronic pain requiring opiods  - Coronary Artery Disease  - Heart failure  - Heart attack  - Stroke  - DVT/VTE  - Cardiac arrhythmia  - Respiratory Failure/COPD  - Renal failure  - Anemia  - Advanced Liver disease

## 2023-07-18 NOTE — H&P (View-Only) (Signed)
 TOTAL KNEE ADMISSION H&P  Patient is being admitted for right total knee arthroplasty.  Subjective:  Chief Complaint:right knee pain.  HPI: Colleen Kemp, 58 y.o. female, has a history of pain and functional disability in the right knee due to arthritis and has failed non-surgical conservative treatments for greater than 12 weeks to includeNSAID's and/or analgesics, corticosteriod injections, viscosupplementation injections, supervised PT with diminished ADL's post treatment, use of assistive devices, and activity modification.  Onset of symptoms was gradual, starting 10 years ago with gradually worsening course since that time. The patient noted no past surgery on the right knee(s).  Patient currently rates pain in the right knee(s) at 10 out of 10 with activity. Patient has night pain, worsening of pain with activity and weight bearing, pain that interferes with activities of daily living, and pain with passive range of motion.  Patient has evidence of periarticular osteophytes and joint space narrowing by imaging studies.  There is no active infection.  Patient Active Problem List   Diagnosis Date Noted   Osteoarthritis of both knees 12/20/2013   Past Medical History:  Diagnosis Date   Arthritis    oa both knees   GERD (gastroesophageal reflux disease)    Hypertension    Sleep apnea    not gotten cpap yet   Thyroid disease     Past Surgical History:  Procedure Laterality Date   ABDOMINAL HYSTERECTOMY     partial   BREAST LUMPECTOMY     cyst   CYSTOSCOPY WITH RETROGRADE PYELOGRAM, URETEROSCOPY AND STENT PLACEMENT Left 04/12/2015   Procedure: CYSTOSCOPY WITH Left RETROGRADE PYELOGRAM, Left URETEROSCOPY , basket stone extraction AND Left Ureteral STENT PLACEMENT;  Surgeon: Sebastian Ache, MD;  Location: [facility];  Service: Urology;  Laterality: Left;   HOLMIUM LASER APPLICATION Left 04/12/2015   Procedure: HOLMIUM LASER APPLICATION;  Surgeon: Sebastian Ache, MD;  Location: [facility];   Service: Urology;  Laterality: Left;   TUBAL LIGATION      Current Outpatient Medications  Medication Sig Dispense Refill Last Dose/Taking   atorvastatin (LIPITOR) 10 MG tablet Take 1 tablet (10 mg total) by mouth daily. 30 tablet 2    Cholecalciferol 25 MCG (1000 UT) capsule Take by mouth.      Cyanocobalamin 1000 MCG TBCR Take by mouth.      diclofenac (VOLTAREN) 75 MG EC tablet Take 1 tablet by mouth twice daily 60 tablet 0    estradiol (VIVELLE-DOT) 0.025 MG/24HR Place onto the skin.      hydrochlorothiazide (HYDRODIURIL) 25 MG tablet Take 1 tablet (25 mg total) by mouth daily. 90 tablet 1    levofloxacin (LEVAQUIN) 500 MG tablet levofloxacin 500 mg tablet      levothyroxine (SYNTHROID) 125 MCG tablet Take 1 tablet (125 mcg total) by mouth daily. 90 tablet 1    metFORMIN (GLUCOPHAGE) 500 MG tablet Take 1 tablet (500 mg total) by mouth 2 (two) times daily with a meal. 60 tablet 2    Naltrexone-buPROPion HCl ER (CONTRAVE) 8-90 MG TB12 Start 1 tablet every morning for 7 days, then 1 tablet twice daily for 7 days, then 2 tablets every morning and one every evening 84 tablet 0    PARoxetine Mesylate (BRISDELLE) 7.5 MG CAPS [place]isd[place] 7.5 mg capsule  Take 1 capsule every day by oral route as directed for 7 days.      predniSONE (STERAPRED UNI-PAK 21 TAB) 5 MG (21) TBPK tablet Take as directed on packaging 21 tablet 0    sertraline (ZOLOFT)  50 MG tablet Take 50 mg by mouth daily.      traMADol (ULTRAM) 50 MG tablet tramadol 50 mg tablet      Vitamins/Minerals TABS Take by mouth.      No current facility-administered medications for this visit.   Allergies  Allergen Reactions   Tramadol Nausea And Vomiting    Social History   Tobacco Use   Smoking status: Never   Smokeless tobacco: Never  Substance Use Topics   Alcohol use: No    Family History  Problem Relation Age of Onset   Hypertension Mother    Diabetes Mother    Hypertension Father      Review of Systems  Musculoskeletal:   Positive for arthralgias.  All other systems reviewed and are negative.   Objective:  Physical Exam Constitutional:      General: She is not in acute distress.    Appearance: Normal appearance. She is not ill-appearing.  HENT:     Head: Normocephalic and atraumatic.     Right Ear: External ear normal.     Left Ear: External ear normal.     Nose: Nose normal.     Mouth/Throat:     Mouth: Mucous membranes are moist.     Pharynx: Oropharynx is clear.  Eyes:     Extraocular Movements: Extraocular movements intact.     Conjunctiva/sclera: Conjunctivae normal.  Cardiovascular:     Rate and Rhythm: Normal rate and regular rhythm.     Pulses: Normal pulses.     Heart sounds: Normal heart sounds.  Pulmonary:     Effort: Pulmonary effort is normal.     Breath sounds: Normal breath sounds.  Abdominal:     General: Bowel sounds are normal.     Palpations: Abdomen is soft.     Tenderness: There is no abdominal tenderness.  Musculoskeletal:        General: Tenderness present.     Cervical back: Normal range of motion and neck supple.     Comments: TTP over medial and lateral joint line.  No calf tenderness, swelling, or erythema.  No overlying lesions of area of chief complaint.  Decreased strength and ROM due to elicited pain.  Pre-operative ROM 0-110.  Dorsiflexion and plantarflexion intact.  Stable to varus and valgus stress.  BLE appear grossly neurovascularly intact.  Gait mildly antalgic.   Skin:    General: Skin is warm and dry.  Neurological:     Mental Status: She is alert and oriented to person, place, and time. Mental status is at baseline.  Psychiatric:        Mood and Affect: Mood normal.        Behavior: Behavior normal.     Vital signs in last 24 hours: @VSRANGES @  Labs:   Estimated body mass index is 36.78 kg/m as calculated from the following:   Height as of 08/11/19: 5\' 5"  (1.651 m).   Weight as of 08/11/19: 100.2 kg.   Imaging Review Plain radiographs  demonstrate severe degenerative joint disease of the right knee(s). The bone quality appears to be fair for age and reported activity level.      Assessment/Plan:  End stage arthritis, right knee   The patient history, physical examination, clinical judgment of the provider and imaging studies are consistent with end stage degenerative joint disease of the right knee(s) and total knee arthroplasty is deemed medically necessary. The treatment options including medical management, injection therapy arthroscopy and arthroplasty were discussed at length. The  risks and benefits of total knee arthroplasty were presented and reviewed. The risks due to aseptic loosening, infection, stiffness, patella tracking problems, thromboembolic complications and other imponderables were discussed. The patient acknowledged the explanation, agreed to proceed with the plan and consent was signed. Patient is being admitted for inpatient treatment for surgery, pain control, PT, OT, prophylactic antibiotics, VTE prophylaxis, progressive ambulation and ADL's and discharge planning. The patient is planning to be discharged home with outpatient PT.     Patient's anticipated LOS is less than 2 midnights, meeting these requirements: - Younger than 70 - Lives within 1 hour of care - Has a competent adult at home to recover with post-op recover - NO history of  - Chronic pain requiring opiods  - Coronary Artery Disease  - Heart failure  - Heart attack  - Stroke  - DVT/VTE  - Cardiac arrhythmia  - Respiratory Failure/COPD  - Renal failure  - Anemia  - Advanced Liver disease

## 2023-07-23 NOTE — Progress Notes (Signed)
 COVID Vaccine received:  []  No [x]  Yes Date of any COVID positive Test in last 90 days:  PCP -  Carley Hammed, FNP medical clearance scanned to Media     Phone: [phone]  Fax: [phone]  Cardiologist -  none  Chest x-ray - 10-30-2018  1v  Epic EKG -  2-+06-2019  Epic   will repeat Stress Test -  ECHO -  Cardiac Cath -   PCR screen: [x]  Ordered & Completed []   No Order but Needs PROFEND     []   N/A for this surgery  Surgery Plan:  [x]  Ambulatory   []  Outpatient in bed  []  Admit Anesthesia:    []  General  [x]  Spinal  []   Choice []   MAC  Pacemaker / ICD device [x]  No []  Yes   Spinal Cord Stimulator:[x]  No []  Yes       History of Sleep Apnea? []  No [x]  Yes   CPAP used?- []  No []  Yes    Does the patient monitor blood sugar?   []  N/A   []  No []  Yes  Patient has: []  NO Hx DM   [x]  Pre-DM   []  DM1  []   DM2 Last A1c was:  6.3  on  05-12-2023    GLP1 agonist / usual dose - Ozempic  injections   Blood Thinner / Instructions:  none Aspirin Instructions:  none  ERAS Protocol Ordered: []  No  [x]  Yes PRE-SURGERY []  ENSURE  [x]  G2  Patient is to be NPO after: 0415  Dental hx: []  Dentures:  []  N/A      []  Bridge or Partial:                   []  Loose or Damaged teeth:   Comments: Patient was given the 5 CHG shower / bath instructions for TKA surgery along with 2 bottles of the CHG soap. Patient will start this on:  07-31-23   All questions were asked and answered, Patient voiced understanding of this process.   Activity level: Patient is able / unable to climb a flight of stairs without difficulty; []  No CP  []  No SOB, but would have ___   Patient can / can not perform ADLs without assistance.   Anesthesia review: HTN, Pre-DM, OSA- no CPAP, GERD, anxiety  Patient denies shortness of breath, fever, cough and chest pain at PAT appointment.  Patient verbalized understanding and agreement to the Pre-Surgical Instructions that were given to them at this PAT appointment. Patient was also  educated of the need to review these PAT instructions again prior to her surgery.I reviewed the appropriate phone numbers to call if they have any and questions or concerns.

## 2023-07-23 NOTE — Patient Instructions (Addendum)
 SURGICAL WAITING ROOM VISITATION Patients having surgery or a procedure may have no more than 2 support people in the waiting area - these visitors may rotate in the visitor waiting room.   Due to an increase in RSV and influenza rates and associated hospitalizations, children ages 53 and under may not visit patients in [facility] hospitals. If the patient needs to stay at the hospital during part of their recovery, the visitor guidelines for inpatient rooms apply.  PRE-OP VISITATION  Pre-op nurse will coordinate an appropriate time for 1 support person to accompany the patient in pre-op.  This support person may not rotate.  This visitor will be contacted when the time is appropriate for the visitor to come back in the pre-op area.  Please refer to the [facility] website for the visitor guidelines for Inpatients (after your surgery is over and you are in a regular room).  You are not required to quarantine at this time prior to your surgery. However, you must do this: Hand Hygiene often Do NOT share personal items Notify your provider if you are in close contact with someone who has COVID or you develop fever 100.4 or greater, new onset of sneezing, cough, sore throat, shortness of breath or body aches.  If you test positive for Covid or have been in contact with anyone that has tested positive in the last 10 days please notify you surgeon.    Your procedure is scheduled on:  MONDAY  August 04, 2023  Report to [facility] Main Entrance: [address] entrance where the [employer] is available.   Report to admitting at: 05:15    AM  Call this number if you have any questions or problems the morning of surgery [phone]  Do not eat food after Midnight the night prior to your surgery/procedure.  After Midnight you may have the following liquids until  04:15 AM DAY OF SURGERY  Clear Liquid Diet Water Black Coffee (sugar ok, NO MILK/CREAM OR CREAMERS)  Tea (sugar ok, NO  MILK/CREAM OR CREAMERS) regular and decaf                             Plain Jell-O  with no fruit (NO RED)                                           Fruit ices (not with fruit pulp, NO RED)                                     Popsicles (NO RED)                                                                  Juice: NO CITRUS JUICES: only apple, WHITE grape, WHITE cranberry Sports drinks like Gatorade or Powerade (NO RED)                   The day of surgery:  Drink ONE (1) Pre-Surgery G2 at   04:15 AM the morning of surgery. Drink  in one sitting. Do not sip.  This drink was given to you during your hospital pre-op appointment visit. Nothing else to drink after completing the Pre-Surgery Clear Ensure : No candy, chewing gum or throat lozenges.    FOLLOW ANY ADDITIONAL PRE OP INSTRUCTIONS YOU RECEIVED FROM YOUR SURGEON'S OFFICE!!!   Oral Hygiene is also important to reduce your risk of infection.        Remember - BRUSH YOUR TEETH THE MORNING OF SURGERY WITH YOUR REGULAR TOOTHPASTE  Do NOT smoke after Midnight the night before surgery.  OZEMPIC- Do not inject within 7-10 days before surgery.  STOP TAKING all Vitamins, Herbs and supplements 1 week before your surgery.   Take ONLY these medicines the morning of surgery with A SIP OF WATER: Levothyroxine, and you may take esomeprazole if needed                  You may not have any metal on your body including hair pins, jewelry, and body piercing  Do not wear make-up, lotions, powders, perfumes  or deodorant  Do not wear nail polish including gel and S&S, artificial / acrylic nails, or any other type of covering on natural nails including finger and toenails. If you have artificial nails, gel coating, etc., that needs to be removed by a nail salon, Please have this removed prior to surgery. Not doing so may mean that your surgery could be cancelled or delayed if the Surgeon or anesthesia staff feels like they are unable to monitor you  safely.   Do not shave 48 hours prior to surgery to avoid nicks in your skin which may contribute to postoperative infections.   Contacts, Hearing Aids, dentures or bridgework may not be worn into surgery. DENTURES WILL BE REMOVED PRIOR TO SURGERY PLEASE DO NOT APPLY "Poly grip" OR ADHESIVES!!!  Patients discharged on the day of surgery will not be allowed to drive home.  Someone NEEDS to stay with you for the first 24 hours after anesthesia.  Do not bring your home medications to the hospital. The Pharmacy will dispense medications listed on your medication list to you during your admission in the Hospital.  Please read over the following fact sheets you were given: IF YOU HAVE QUESTIONS ABOUT YOUR PRE-OP INSTRUCTIONS, PLEASE CALL [phone].     Pre-operative 5 CHG Bath Instructions   You can play a key role in reducing the risk of infection after surgery. Your skin needs to be as free of germs as possible. You can reduce the number of germs on your skin by washing with CHG (chlorhexidine gluconate) soap before surgery. CHG is an antiseptic soap that kills germs and continues to kill germs even after washing.   DO NOT use if you have an allergy to chlorhexidine/CHG or antibacterial soaps. If your skin becomes reddened or irritated, stop using the CHG and notify one of our RNs at [phone]  Please shower with the CHG soap starting 4 days before surgery using the following schedule: START SHOWERS ON  THURSDAY  July 31, 2023  Please keep in mind the following:  DO NOT shave, including legs and underarms, starting the day of your first shower.   You may shave your face at any point before/day of surgery.   Place clean sheets on your bed the day you start using CHG soap. Use a clean washcloth (not used since being  washed) for each shower. DO NOT sleep with pets once you start using the CHG.   CHG Shower Instructions:  If you choose to wash your hair and private area, wash first with your normal shampoo/soap.  After you use shampoo/soap, rinse your hair and body thoroughly to remove shampoo/soap residue.  Turn the water OFF and apply about 3 tablespoons (45 ml) of CHG soap to a CLEAN washcloth.  Apply CHG soap ONLY FROM YOUR NECK DOWN TO YOUR TOES (washing for 3-5 minutes)  DO NOT use CHG soap on face, private areas, open wounds, or sores.  Pay special attention to the area where your surgery is being performed.  If you are having back surgery, having someone wash your back for you may be helpful.  Wait 2 minutes after CHG soap is applied, then you may rinse off the CHG soap.  Pat dry with a clean towel  Put on clean clothes/pajamas   If you choose to wear lotion, please use ONLY the CHG-compatible lotions on the back of this paper.     Additional instructions for the day of surgery: DO NOT APPLY any lotions, deodorants, cologne, or perfumes.   Put on clean/comfortable clothes.  Brush your teeth.  Ask your nurse before applying any prescription medications to the skin.      CHG Compatible Lotions   Aveeno Moisturizing lotion  Cetaphil Moisturizing Cream  Cetaphil Moisturizing Lotion  Clairol Herbal Essence Moisturizing Lotion, Dry Skin  Clairol Herbal Essence Moisturizing Lotion, Extra Dry Skin  Clairol Herbal Essence Moisturizing Lotion, Normal Skin  Curel Age Defying Therapeutic Moisturizing Lotion with Alpha Hydroxy  Curel Extreme Care Body Lotion  Curel Soothing Hands Moisturizing Hand Lotion  Curel Therapeutic Moisturizing Cream, Fragrance-Free  Curel Therapeutic Moisturizing Lotion, Fragrance-Free  Curel Therapeutic Moisturizing Lotion, Original Formula  Eucerin Daily Replenishing Lotion  Eucerin Dry Skin Therapy Plus Alpha Hydroxy Crme  Eucerin Dry Skin Therapy Plus Alpha  Hydroxy Lotion  Eucerin Original Crme  Eucerin Original Lotion  Eucerin Plus Crme Eucerin Plus Lotion  Eucerin TriLipid Replenishing Lotion  Keri Anti-Bacterial Hand Lotion  Keri Deep Conditioning Original Lotion Dry Skin Formula Softly Scented  Keri Deep Conditioning Original Lotion, Fragrance Free Sensitive Skin Formula  Keri Lotion Fast Absorbing Fragrance Free Sensitive Skin Formula  Keri Lotion Fast Absorbing Softly Scented Dry Skin Formula  Keri Original Lotion  Keri Skin Renewal Lotion Keri Silky Smooth Lotion  Keri Silky Smooth Sensitive Skin Lotion  Nivea Body Creamy Conditioning Oil  Nivea Body Extra Enriched Lotion  Nivea Body Original Lotion  Nivea Body Sheer Moisturizing Lotion Nivea Crme  Nivea Skin Firming Lotion  NutraDerm 30 Skin Lotion  NutraDerm Skin Lotion  NutraDerm Therapeutic Skin Cream  NutraDerm Therapeutic Skin Lotion  ProShield Protective Hand Cream  Provon moisturizing lotion   FAILURE TO FOLLOW THESE INSTRUCTIONS MAY RESULT IN THE CANCELLATION OF YOUR SURGERY  PATIENT SIGNATURE_________________________________  NURSE SIGNATURE__________________________________  ________________________________________________________________________       Colleen Kemp    An incentive spirometer is a tool that can help keep your lungs clear and active. This tool measures how well you are filling your lungs with each breath. Taking  long deep breaths may help reverse or decrease the chance of developing breathing (pulmonary) problems (especially infection) following: A long period of time when you are unable to move or be active. BEFORE THE PROCEDURE  If the spirometer includes an indicator to show your best effort, your nurse or respiratory therapist will set it to a desired goal. If possible, sit up straight or lean slightly forward. Try not to slouch. Hold the incentive spirometer in an upright position. INSTRUCTIONS FOR USE  Sit on the edge of  your bed if possible, or sit up as far as you can in bed or on a chair. Hold the incentive spirometer in an upright position. Breathe out normally. Place the mouthpiece in your mouth and seal your lips tightly around it. Breathe in slowly and as deeply as possible, raising the piston or the ball toward the top of the column. Hold your breath for 3-5 seconds or for as long as possible. Allow the piston or ball to fall to the bottom of the column. Remove the mouthpiece from your mouth and breathe out normally. Rest for a few seconds and repeat Steps 1 through 7 at least 10 times every 1-2 hours when you are awake. Take your time and take a few normal breaths between deep breaths. The spirometer may include an indicator to show your best effort. Use the indicator as a goal to work toward during each repetition. After each set of 10 deep breaths, practice coughing to be sure your lungs are clear. If you have an incision (the cut made at the time of surgery), support your incision when coughing by placing a pillow or rolled up towels firmly against it. Once you are able to get out of bed, walk around indoors and cough well. You may stop using the incentive spirometer when instructed by your caregiver.  RISKS AND COMPLICATIONS Take your time so you do not get dizzy or light-headed. If you are in pain, you may need to take or ask for pain medication before doing incentive spirometry. It is harder to take a deep breath if you are having pain. AFTER USE Rest and breathe slowly and easily. It can be helpful to keep track of a log of your progress. Your caregiver can provide you with a simple table to help with this. If you are using the spirometer at home, follow these instructions: SEEK MEDICAL CARE IF:  You are having difficultly using the spirometer. You have trouble using the spirometer as often as instructed. Your pain medication is not giving enough relief while using the spirometer. You develop  fever of 100.5 F (38.1 C) or higher.                                                                                                    SEEK IMMEDIATE MEDICAL CARE IF:  You cough up bloody sputum that had not been present before. You develop fever of 102 F (38.9 C) or greater. You develop worsening pain at or near the incision site. MAKE SURE YOU:  Understand these instructions. Will watch your condition.  Will get help right away if you are not doing well or get worse. Document Released: 09/02/2006 Document Revised: 07/15/2011 Document Reviewed: 11/03/2006 [facility] Patient Information 2014 [place], [place].      If you would like to see a video about joint replacement:   [email or web address]

## 2023-07-24 ENCOUNTER — Encounter (HOSPITAL_COMMUNITY)
Admission: RE | Admit: 2023-07-24 | Discharge: 2023-07-24 | Disposition: A | Discharge status: Home / Self Care | Payer: Self-pay | Source: Ambulatory Visit | Attending: Orthopedic Surgery | Admitting: Orthopedic Surgery

## 2023-07-24 ENCOUNTER — Encounter (HOSPITAL_COMMUNITY): Payer: Self-pay

## 2023-07-24 ENCOUNTER — Other Ambulatory Visit: Payer: Self-pay

## 2023-07-24 VITALS — BP 123/67 | HR 88 | Temp 98.8°F | Resp 16 | Ht 65.5 in | Wt 212.0 lb

## 2023-07-24 DIAGNOSIS — Z01818 Encounter for other preprocedural examination: Secondary | ICD-10-CM | POA: Diagnosis present

## 2023-07-24 DIAGNOSIS — M25561 Pain in right knee: Secondary | ICD-10-CM | POA: Insufficient documentation

## 2023-07-24 DIAGNOSIS — G8929 Other chronic pain: Secondary | ICD-10-CM | POA: Diagnosis not present

## 2023-07-24 DIAGNOSIS — M1711 Unilateral primary osteoarthritis, right knee: Secondary | ICD-10-CM | POA: Insufficient documentation

## 2023-07-24 DIAGNOSIS — I10 Essential (primary) hypertension: Secondary | ICD-10-CM | POA: Insufficient documentation

## 2023-07-24 DIAGNOSIS — R7303 Prediabetes: Secondary | ICD-10-CM | POA: Diagnosis not present

## 2023-07-24 HISTORY — DX: Pneumonia, unspecified organism: J18.9

## 2023-07-24 HISTORY — DX: Prediabetes: R73.03

## 2023-07-24 HISTORY — DX: Chronic kidney disease, unspecified: N18.9

## 2023-07-24 HISTORY — DX: Hypothyroidism, unspecified: E03.9

## 2023-07-24 HISTORY — DX: Personal history of urinary calculi: Z87.442

## 2023-07-24 LAB — CBC WITH DIFFERENTIAL/PLATELET
Abs Immature Granulocytes: 0.03 10*3/uL (ref 0.00–0.07)
Basophils Absolute: 0 10*3/uL (ref 0.0–0.1)
Basophils Relative: 0 %
Eosinophils Absolute: 0.2 10*3/uL (ref 0.0–0.5)
Eosinophils Relative: 2 %
HCT: 43.6 % (ref 36.0–46.0)
Hemoglobin: 13.8 g/dL (ref 12.0–15.0)
Immature Granulocytes: 0 %
Lymphocytes Relative: 31 %
Lymphs Abs: 2.4 10*3/uL (ref 0.7–4.0)
MCH: 27.8 pg (ref 26.0–34.0)
MCHC: 31.7 g/dL (ref 30.0–36.0)
MCV: 87.7 fL (ref 80.0–100.0)
Monocytes Absolute: 0.7 10*3/uL (ref 0.1–1.0)
Monocytes Relative: 9 %
Neutro Abs: 4.4 10*3/uL (ref 1.7–7.7)
Neutrophils Relative %: 58 %
Platelets: 286 10*3/uL (ref 150–400)
RBC: 4.97 MIL/uL (ref 3.87–5.11)
RDW: 14 % (ref 11.5–15.5)
WBC: 7.8 10*3/uL (ref 4.0–10.5)
nRBC: 0 % (ref 0.0–0.2)

## 2023-07-24 LAB — COMPREHENSIVE METABOLIC PANEL
ALT: 27 U/L (ref 0–44)
AST: 26 U/L (ref 15–41)
Albumin: 4.2 g/dL (ref 3.5–5.0)
Alkaline Phosphatase: 63 U/L (ref 38–126)
Anion gap: 11 (ref 5–15)
BUN: 17 mg/dL (ref 6–20)
CO2: 26 mmol/L (ref 22–32)
Calcium: 9.9 mg/dL (ref 8.9–10.3)
Chloride: 102 mmol/L (ref 98–111)
Creatinine, Ser: 1.1 mg/dL — ABNORMAL HIGH (ref 0.44–1.00)
GFR, Estimated: 59 mL/min — ABNORMAL LOW (ref >60)
Glucose, Bld: 105 mg/dL — ABNORMAL HIGH (ref 70–99)
Potassium: 3.5 mmol/L (ref 3.5–5.1)
Sodium: 139 mmol/L (ref 135–145)
Total Bilirubin: 0.6 mg/dL (ref 0.0–1.2)
Total Protein: 7.9 g/dL (ref 6.5–8.1)

## 2023-07-24 LAB — TYPE AND SCREEN
ABO/RH(D): O POS
Antibody Screen: NEGATIVE

## 2023-07-24 LAB — SURGICAL PCR SCREEN
MRSA, PCR: NEGATIVE
Staphylococcus aureus: NEGATIVE

## 2023-08-03 NOTE — Anesthesia Preprocedure Evaluation (Signed)
 Anesthesia Evaluation  Patient identified by MRN, date of birth, ID band Patient awake    Reviewed: Allergy & Precautions, NPO status , Patient's Chart, lab work & pertinent test results  Airway Mallampati: II  TM Distance: >3 FB Neck ROM: Full    Dental no notable dental hx. (+) Teeth Intact, Dental Advisory Given   Pulmonary sleep apnea (no cpap)    Pulmonary exam normal breath sounds clear to auscultation       Cardiovascular hypertension, Pt. on medications (-) angina (-) Past MI Normal cardiovascular exam Rhythm:Regular Rate:Normal     Neuro/Psych    GI/Hepatic ,GERD  Medicated and Controlled,,  Endo/Other  diabetesHypothyroidism    Renal/GU Renal disease Hx of nephrolithiasis  Lab Results      Component                Value               Date                     K                        3.5                 07/24/2023                 CREATININE               1.10 (H)            07/24/2023                     Musculoskeletal  (+) Arthritis , Osteoarthritis,    Abdominal   Peds  Hematology Lab Results      Component                Value               Date                      WBC                      7.8                 07/24/2023                HGB                      13.8                07/24/2023                HCT                      43.6                07/24/2023                MCV                      87.7                07/24/2023                PLT  286                 07/24/2023              Anesthesia Other Findings All: tramadol  Reproductive/Obstetrics                             Anesthesia Physical Anesthesia Plan  ASA: 3  Anesthesia Plan: Spinal and Regional   Post-op Pain Management: Regional block* and Minimal or no pain anticipated   Induction:   PONV Risk Score and Plan: 3 and Treatment may vary due to age or medical condition,  Midazolam, Ondansetron and Propofol infusion  Airway Management Planned: Nasal Cannula and Natural Airway  Additional Equipment: None  Intra-op Plan:   Post-operative Plan:   Informed Consent: I have reviewed the patients History and Physical, chart, labs and discussed the procedure including the risks, benefits and alternatives for the proposed anesthesia with the patient or authorized representative who has indicated his/her understanding and acceptance.     Dental advisory given  Plan Discussed with: CRNA and Surgeon  Anesthesia Plan Comments: (Spinal w R adductor + propafol infusion)        Anesthesia Quick Evaluation

## 2023-08-04 ENCOUNTER — Ambulatory Visit (HOSPITAL_BASED_OUTPATIENT_CLINIC_OR_DEPARTMENT_OTHER): Admitting: Anesthesiology

## 2023-08-04 ENCOUNTER — Other Ambulatory Visit: Payer: Self-pay

## 2023-08-04 ENCOUNTER — Encounter (HOSPITAL_COMMUNITY)
Admission: RE | Disposition: A | Discharge status: Home / Self Care | Payer: Self-pay | Source: Ambulatory Visit | Attending: Orthopedic Surgery

## 2023-08-04 ENCOUNTER — Observation Stay (HOSPITAL_COMMUNITY)
Admission: RE | Admit: 2023-08-04 | Discharge: 2023-08-05 | Disposition: A | Discharge status: Home / Self Care | Payer: Worker's Compensation | Source: Ambulatory Visit | Attending: Orthopedic Surgery | Admitting: Orthopedic Surgery

## 2023-08-04 ENCOUNTER — Encounter (HOSPITAL_COMMUNITY): Payer: Self-pay | Admitting: Orthopedic Surgery

## 2023-08-04 ENCOUNTER — Ambulatory Visit (HOSPITAL_COMMUNITY): Payer: Self-pay | Admitting: Anesthesiology

## 2023-08-04 ENCOUNTER — Ambulatory Visit (HOSPITAL_COMMUNITY): Payer: Self-pay

## 2023-08-04 DIAGNOSIS — I129 Hypertensive chronic kidney disease with stage 1 through stage 4 chronic kidney disease, or unspecified chronic kidney disease: Secondary | ICD-10-CM | POA: Insufficient documentation

## 2023-08-04 DIAGNOSIS — Z7984 Long term (current) use of oral hypoglycemic drugs: Secondary | ICD-10-CM | POA: Diagnosis not present

## 2023-08-04 DIAGNOSIS — E039 Hypothyroidism, unspecified: Secondary | ICD-10-CM | POA: Insufficient documentation

## 2023-08-04 DIAGNOSIS — M1711 Unilateral primary osteoarthritis, right knee: Secondary | ICD-10-CM

## 2023-08-04 DIAGNOSIS — N189 Chronic kidney disease, unspecified: Secondary | ICD-10-CM | POA: Diagnosis not present

## 2023-08-04 DIAGNOSIS — Z79899 Other long term (current) drug therapy: Secondary | ICD-10-CM | POA: Diagnosis not present

## 2023-08-04 DIAGNOSIS — I10 Essential (primary) hypertension: Secondary | ICD-10-CM

## 2023-08-04 DIAGNOSIS — E119 Type 2 diabetes mellitus without complications: Secondary | ICD-10-CM | POA: Diagnosis not present

## 2023-08-04 HISTORY — PX: TOTAL KNEE ARTHROPLASTY: SHX125

## 2023-08-04 LAB — GLUCOSE, CAPILLARY
Glucose-Capillary: 111 mg/dL — ABNORMAL HIGH (ref 70–99)
Glucose-Capillary: 193 mg/dL — ABNORMAL HIGH (ref 70–99)
Glucose-Capillary: 169 mg/dL — ABNORMAL HIGH (ref 70–99)

## 2023-08-04 LAB — ABO/RH: ABO/RH(D): O POS

## 2023-08-04 SURGERY — ARTHROPLASTY, KNEE, TOTAL
Anesthesia: Regional | Site: Knee | Laterality: Right

## 2023-08-04 MED ORDER — LACTATED RINGERS IV BOLUS: 250.0000 mL | Freq: Once | INTRAVENOUS | Status: DC

## 2023-08-04 MED ORDER — ONDANSETRON HCL 4 MG/2ML IJ SOLN: 4.0000 mg | Freq: Once | INTRAMUSCULAR | Status: DC | PRN

## 2023-08-04 MED ORDER — LACTATED RINGERS IV SOLN
INTRAVENOUS | Status: DC
Start: 2023-08-04 — End: 2023-08-04

## 2023-08-04 MED ORDER — PHENOL 1.4 % MT LIQD: 1.0000 | OROMUCOSAL | Status: DC | PRN

## 2023-08-04 MED ORDER — LACTATED RINGERS IV SOLN: INTRAVENOUS | Status: DC

## 2023-08-04 MED ORDER — INSULIN ASPART 100 UNIT/ML IJ SOLN: 0.0000 [IU] | INTRAMUSCULAR | Status: DC | PRN

## 2023-08-04 MED ORDER — BUPIVACAINE-EPINEPHRINE (PF) 0.25% -1:200000 IJ SOLN
INTRAMUSCULAR | Status: AC
  Filled 2023-08-04: qty 30

## 2023-08-04 MED ORDER — ONDANSETRON HCL 4 MG PO TABS: 4.0000 mg | ORAL_TABLET | Freq: Three times a day (TID) | ORAL | 0 refills | Status: AC | PRN

## 2023-08-04 MED ORDER — BUPIVACAINE LIPOSOME 1.3 % IJ SUSP: 20.0000 mL | Freq: Once | INTRAMUSCULAR | Status: DC

## 2023-08-04 MED ORDER — DEXAMETHASONE SODIUM PHOSPHATE 10 MG/ML IJ SOLN
INTRAMUSCULAR | Status: AC
  Filled 2023-08-04: qty 1

## 2023-08-04 MED ORDER — ONDANSETRON HCL 4 MG/2ML IJ SOLN
INTRAMUSCULAR | Status: DC | PRN
  Administered 2023-08-04: 4 mg via INTRAVENOUS

## 2023-08-04 MED ORDER — CELECOXIB 100 MG PO CAPS: 100.0000 mg | ORAL_CAPSULE | Freq: Two times a day (BID) | ORAL | 0 refills | Status: AC

## 2023-08-04 MED ORDER — ACETAMINOPHEN 500 MG PO TABS
1000.0000 mg | ORAL_TABLET | Freq: Once | ORAL | Status: AC
  Administered 2023-08-04: 1000 mg via ORAL
  Filled 2023-08-04: qty 2

## 2023-08-04 MED ORDER — BUPIVACAINE IN DEXTROSE 0.75-8.25 % IT SOLN
INTRATHECAL | Status: DC | PRN
  Administered 2023-08-04: 13.5 mg via INTRATHECAL

## 2023-08-04 MED ORDER — ASPIRIN 81 MG PO TBEC
81.0000 mg | DELAYED_RELEASE_TABLET | Freq: Two times a day (BID) | ORAL | Status: AC
Start: 2023-08-05 — End: 2023-09-02

## 2023-08-04 MED ORDER — PANTOPRAZOLE SODIUM 40 MG PO TBEC
40.0000 mg | DELAYED_RELEASE_TABLET | Freq: Every day | ORAL | Status: DC
  Administered 2023-08-04 – 2023-08-05 (×2): 40 mg via ORAL
  Filled 2023-08-04 (×2): qty 1

## 2023-08-04 MED ORDER — ONDANSETRON HCL 4 MG/2ML IJ SOLN
INTRAMUSCULAR | Status: AC
  Filled 2023-08-04: qty 2

## 2023-08-04 MED ORDER — METHOCARBAMOL 500 MG PO TABS
ORAL_TABLET | ORAL | Status: AC
  Filled 2023-08-04: qty 1

## 2023-08-04 MED ORDER — FENTANYL CITRATE (PF) 100 MCG/2ML IJ SOLN
INTRAMUSCULAR | Status: DC | PRN
  Administered 2023-08-04: 100 ug via INTRAVENOUS

## 2023-08-04 MED ORDER — CLONIDINE HCL (ANALGESIA) 100 MCG/ML EP SOLN
EPIDURAL | Status: DC | PRN
  Administered 2023-08-04: 100 ug

## 2023-08-04 MED ORDER — ONDANSETRON HCL 4 MG PO TABS: 4.0000 mg | ORAL_TABLET | Freq: Four times a day (QID) | ORAL | Status: DC | PRN

## 2023-08-04 MED ORDER — METHOCARBAMOL 500 MG PO TABS
500.0000 mg | ORAL_TABLET | Freq: Four times a day (QID) | ORAL | Status: DC | PRN
  Administered 2023-08-04: 500 mg via ORAL

## 2023-08-04 MED ORDER — LOSARTAN POTASSIUM 50 MG PO TABS
50.0000 mg | ORAL_TABLET | Freq: Every day | ORAL | Status: DC
  Administered 2023-08-05: 50 mg via ORAL
  Filled 2023-08-04: qty 1

## 2023-08-04 MED ORDER — HYDROMORPHONE HCL 1 MG/ML IJ SOLN: 0.5000 mg | INTRAMUSCULAR | Status: DC | PRN

## 2023-08-04 MED ORDER — POLYETHYLENE GLYCOL 3350 17 G PO PACK: 17.0000 g | PACK | Freq: Every day | ORAL | 0 refills | Status: AC

## 2023-08-04 MED ORDER — MIDAZOLAM HCL 2 MG/2ML IJ SOLN
INTRAMUSCULAR | Status: AC
  Filled 2023-08-04: qty 2

## 2023-08-04 MED ORDER — PROPOFOL 10 MG/ML IV BOLUS
INTRAVENOUS | Status: AC
  Filled 2023-08-04: qty 20

## 2023-08-04 MED ORDER — ACETAMINOPHEN 500 MG PO TABS: 1000.0000 mg | ORAL_TABLET | Freq: Three times a day (TID) | ORAL | Status: AC | PRN

## 2023-08-04 MED ORDER — MENTHOL 3 MG MT LOZG: 1.0000 | LOZENGE | OROMUCOSAL | Status: DC | PRN

## 2023-08-04 MED ORDER — FENTANYL CITRATE (PF) 100 MCG/2ML IJ SOLN
INTRAMUSCULAR | Status: AC
  Filled 2023-08-04: qty 2

## 2023-08-04 MED ORDER — PHENYLEPHRINE HCL-NACL 20-0.9 MG/250ML-% IV SOLN
INTRAVENOUS | Status: DC | PRN
  Administered 2023-08-04: 40 ug/min via INTRAVENOUS

## 2023-08-04 MED ORDER — POLYETHYLENE GLYCOL 3350 17 G PO PACK: 17.0000 g | PACK | Freq: Every day | ORAL | Status: DC | PRN

## 2023-08-04 MED ORDER — PROPOFOL 500 MG/50ML IV EMUL
INTRAVENOUS | Status: DC | PRN
  Administered 2023-08-04: 125 ug/kg/min via INTRAVENOUS

## 2023-08-04 MED ORDER — HYDROCHLOROTHIAZIDE 25 MG PO TABS
25.0000 mg | ORAL_TABLET | Freq: Every day | ORAL | Status: DC
  Administered 2023-08-05: 25 mg via ORAL
  Filled 2023-08-04: qty 1

## 2023-08-04 MED ORDER — INSULIN ASPART 100 UNIT/ML IJ SOLN
0.0000 [IU] | Freq: Three times a day (TID) | INTRAMUSCULAR | Status: DC
  Administered 2023-08-04: 3 [IU] via SUBCUTANEOUS
  Administered 2023-08-05: 2 [IU] via SUBCUTANEOUS

## 2023-08-04 MED ORDER — SODIUM CHLORIDE 0.9 % IR SOLN
Status: DC | PRN
  Administered 2023-08-04: 1000 mL

## 2023-08-04 MED ORDER — ROPIVACAINE HCL 5 MG/ML IJ SOLN
INTRAMUSCULAR | Status: DC | PRN
  Administered 2023-08-04: 30 mL via PERINEURAL

## 2023-08-04 MED ORDER — LACTATED RINGERS IV BOLUS
250.0000 mL | Freq: Once | INTRAVENOUS | Status: AC
  Administered 2023-08-04: 250 mL via INTRAVENOUS

## 2023-08-04 MED ORDER — CHLORHEXIDINE GLUCONATE 0.12 % MT SOLN
15.0000 mL | Freq: Once | OROMUCOSAL | Status: AC
  Administered 2023-08-04: 15 mL via OROMUCOSAL

## 2023-08-04 MED ORDER — METHOCARBAMOL 1000 MG/10ML IJ SOLN: 500.0000 mg | Freq: Four times a day (QID) | INTRAMUSCULAR | Status: DC | PRN

## 2023-08-04 MED ORDER — CEFAZOLIN SODIUM-DEXTROSE 2-4 GM/100ML-% IV SOLN
INTRAVENOUS | Status: AC
  Filled 2023-08-04: qty 100

## 2023-08-04 MED ORDER — CEFAZOLIN SODIUM-DEXTROSE 2-4 GM/100ML-% IV SOLN
2.0000 g | Freq: Four times a day (QID) | INTRAVENOUS | Status: AC
  Administered 2023-08-04 (×2): 2 g via INTRAVENOUS
  Filled 2023-08-04: qty 100

## 2023-08-04 MED ORDER — KETOROLAC TROMETHAMINE 15 MG/ML IJ SOLN
15.0000 mg | Freq: Four times a day (QID) | INTRAMUSCULAR | Status: DC
  Administered 2023-08-04 – 2023-08-05 (×2): 15 mg via INTRAVENOUS
  Filled 2023-08-04 (×2): qty 1

## 2023-08-04 MED ORDER — OXYCODONE HCL 5 MG PO TABS: 2.5000 mg | ORAL_TABLET | Freq: Four times a day (QID) | ORAL | 0 refills | Status: AC | PRN

## 2023-08-04 MED ORDER — HYDROMORPHONE HCL 1 MG/ML IJ SOLN
INTRAMUSCULAR | Status: AC
  Filled 2023-08-04: qty 1

## 2023-08-04 MED ORDER — BUPIVACAINE LIPOSOME 1.3 % IJ SUSP
INTRAMUSCULAR | Status: AC
  Filled 2023-08-04: qty 20

## 2023-08-04 MED ORDER — OXYCODONE HCL 5 MG PO TABS
ORAL_TABLET | ORAL | Status: AC
  Administered 2023-08-04: 5 mg via ORAL
  Filled 2023-08-04: qty 1

## 2023-08-04 MED ORDER — ISOPROPYL ALCOHOL 70 % SOLN
Status: AC
  Filled 2023-08-04: qty 480

## 2023-08-04 MED ORDER — 0.9 % SODIUM CHLORIDE (POUR BTL) OPTIME
TOPICAL | Status: DC | PRN
  Administered 2023-08-04: 1000 mL

## 2023-08-04 MED ORDER — DOCUSATE SODIUM 100 MG PO CAPS
100.0000 mg | ORAL_CAPSULE | Freq: Two times a day (BID) | ORAL | Status: DC
  Administered 2023-08-04 – 2023-08-05 (×2): 100 mg via ORAL
  Filled 2023-08-04 (×2): qty 1

## 2023-08-04 MED ORDER — OXYCODONE HCL 5 MG PO TABS
5.0000 mg | ORAL_TABLET | ORAL | Status: DC | PRN
  Administered 2023-08-04 – 2023-08-05 (×3): 5 mg via ORAL
  Filled 2023-08-04 (×3): qty 1

## 2023-08-04 MED ORDER — WATER FOR IRRIGATION, STERILE IR SOLN
Status: DC | PRN
  Administered 2023-08-04: 1000 mL

## 2023-08-04 MED ORDER — ACETAMINOPHEN 500 MG PO TABS
1000.0000 mg | ORAL_TABLET | Freq: Four times a day (QID) | ORAL | Status: AC
  Administered 2023-08-04 – 2023-08-05 (×4): 1000 mg via ORAL
  Filled 2023-08-04 (×4): qty 2

## 2023-08-04 MED ORDER — TRANEXAMIC ACID-NACL 1000-0.7 MG/100ML-% IV SOLN
1000.0000 mg | INTRAVENOUS | Status: AC
  Administered 2023-08-04: 1000 mg via INTRAVENOUS
  Filled 2023-08-04: qty 100

## 2023-08-04 MED ORDER — LACTATED RINGERS IV BOLUS
500.0000 mL | Freq: Once | INTRAVENOUS | Status: AC
  Administered 2023-08-04: 500 mL via INTRAVENOUS

## 2023-08-04 MED ORDER — ASPIRIN 81 MG PO CHEW
81.0000 mg | CHEWABLE_TABLET | Freq: Two times a day (BID) | ORAL | Status: DC
  Administered 2023-08-04 – 2023-08-05 (×2): 81 mg via ORAL
  Filled 2023-08-04 (×2): qty 1

## 2023-08-04 MED ORDER — PHENYLEPHRINE HCL (PRESSORS) 10 MG/ML IV SOLN
INTRAVENOUS | Status: DC | PRN
  Administered 2023-08-04 (×2): 80 ug via INTRAVENOUS

## 2023-08-04 MED ORDER — LEVOTHYROXINE SODIUM 125 MCG PO TABS
125.0000 ug | ORAL_TABLET | Freq: Every day | ORAL | Status: DC
Start: 2023-08-05 — End: 2023-08-05
  Administered 2023-08-05: 125 ug via ORAL
  Filled 2023-08-04: qty 1

## 2023-08-04 MED ORDER — OXYCODONE HCL 5 MG PO TABS: 5.0000 mg | ORAL_TABLET | Freq: Once | ORAL | Status: AC | PRN

## 2023-08-04 MED ORDER — ORAL CARE MOUTH RINSE: 15.0000 mL | Freq: Once | OROMUCOSAL | Status: AC

## 2023-08-04 MED ORDER — ONDANSETRON HCL 4 MG/2ML IJ SOLN: 4.0000 mg | Freq: Four times a day (QID) | INTRAMUSCULAR | Status: DC | PRN

## 2023-08-04 MED ORDER — SODIUM CHLORIDE (PF) 0.9 % IJ SOLN
INTRAMUSCULAR | Status: AC
Start: 2023-08-04 — End: ?
  Filled 2023-08-04: qty 30

## 2023-08-04 MED ORDER — METHOCARBAMOL 500 MG PO TABS: 500.0000 mg | ORAL_TABLET | Freq: Three times a day (TID) | ORAL | 0 refills | Status: AC | PRN

## 2023-08-04 MED ORDER — HYDROMORPHONE HCL 1 MG/ML IJ SOLN
0.2500 mg | INTRAMUSCULAR | Status: DC | PRN
  Administered 2023-08-04 (×2): 0.5 mg via INTRAVENOUS

## 2023-08-04 MED ORDER — OXYCODONE HCL 5 MG/5ML PO SOLN: 5.0000 mg | Freq: Once | ORAL | Status: AC | PRN

## 2023-08-04 MED ORDER — SODIUM CHLORIDE (PF) 0.9 % IJ SOLN
INTRAMUSCULAR | Status: DC | PRN
  Administered 2023-08-04: 30 mL

## 2023-08-04 MED ORDER — KETOROLAC TROMETHAMINE 30 MG/ML IJ SOLN
INTRAMUSCULAR | Status: AC
  Administered 2023-08-04: 30 mg via INTRAVENOUS
  Filled 2023-08-04: qty 1

## 2023-08-04 MED ORDER — KETOROLAC TROMETHAMINE 30 MG/ML IJ SOLN
INTRAMUSCULAR | Status: AC
  Filled 2023-08-04: qty 1

## 2023-08-04 MED ORDER — ISOPROPYL ALCOHOL 70 % SOLN
Status: DC | PRN
  Administered 2023-08-04: 1 via TOPICAL

## 2023-08-04 MED ORDER — DIPHENHYDRAMINE HCL 12.5 MG/5ML PO ELIX: 12.5000 mg | ORAL_SOLUTION | ORAL | Status: DC | PRN

## 2023-08-04 MED ORDER — DEXMEDETOMIDINE HCL IN NACL 80 MCG/20ML IV SOLN
INTRAVENOUS | Status: AC
  Filled 2023-08-04: qty 20

## 2023-08-04 MED ORDER — POVIDONE-IODINE 10 % EX SWAB
2.0000 | Freq: Once | CUTANEOUS | Status: DC
Start: 2023-08-04 — End: 2023-08-04

## 2023-08-04 MED ORDER — CEFAZOLIN SODIUM-DEXTROSE 2-4 GM/100ML-% IV SOLN
2.0000 g | INTRAVENOUS | Status: AC
  Administered 2023-08-04: 2 g via INTRAVENOUS
  Filled 2023-08-04: qty 100

## 2023-08-04 MED ORDER — MIDAZOLAM HCL 2 MG/2ML IJ SOLN
INTRAMUSCULAR | Status: DC | PRN
  Administered 2023-08-04: 2 mg via INTRAVENOUS

## 2023-08-04 MED ORDER — SODIUM CHLORIDE 0.9 % IV SOLN
INTRAVENOUS | Status: DC
Start: 2023-08-04 — End: 2023-08-04

## 2023-08-04 MED ORDER — ATORVASTATIN CALCIUM 10 MG PO TABS
10.0000 mg | ORAL_TABLET | Freq: Every day | ORAL | Status: DC
  Administered 2023-08-04 – 2023-08-05 (×2): 10 mg via ORAL
  Filled 2023-08-04 (×2): qty 1

## 2023-08-04 MED ORDER — PROPOFOL 1000 MG/100ML IV EMUL
INTRAVENOUS | Status: AC
  Filled 2023-08-04: qty 100

## 2023-08-04 MED ORDER — ACETAMINOPHEN 325 MG PO TABS: 325.0000 mg | ORAL_TABLET | Freq: Four times a day (QID) | ORAL | Status: DC | PRN

## 2023-08-04 MED ORDER — DEXAMETHASONE SODIUM PHOSPHATE 4 MG/ML IJ SOLN
4.0000 mg | Freq: Once | INTRAMUSCULAR | Status: AC
  Administered 2023-08-04: 5 mg via INTRAVENOUS

## 2023-08-04 MED ORDER — KETOROLAC TROMETHAMINE 15 MG/ML IJ SOLN
INTRAMUSCULAR | Status: DC | PRN
Start: 2023-08-04 — End: 2023-08-04
  Administered 2023-08-04: 15 mg via INTRAVENOUS

## 2023-08-04 MED ORDER — KETOROLAC TROMETHAMINE 30 MG/ML IJ SOLN: 30.0000 mg | Freq: Once | INTRAMUSCULAR | Status: AC | PRN

## 2023-08-04 SURGICAL SUPPLY — 57 items
BAG COUNTER SPONGE SURGICOUNT (BAG) IMPLANT
BLADE SAG 18X100X1.27 (BLADE) ×1 IMPLANT
BLADE SAW SAG 35X64 .89 (BLADE) ×1 IMPLANT
BLADE SAW SGTL 11.0X1.19X90.0M (BLADE) IMPLANT
BNDG COHESIVE 3X5 TAN ST LF (GAUZE/BANDAGES/DRESSINGS) ×1 IMPLANT
BNDG ELASTIC 4X5.8 VLCR NS LF (GAUZE/BANDAGES/DRESSINGS) IMPLANT
BNDG ELASTIC 6X10 VLCR STRL LF (GAUZE/BANDAGES/DRESSINGS) ×1 IMPLANT
BOWL SMART MIX CTS (DISPOSABLE) ×1 IMPLANT
CEMENT BONE R 1X40 (Cement) IMPLANT
CEMENT BONE REFOBACIN R1X40 US (Cement) IMPLANT
CHLORAPREP W/TINT 26 (MISCELLANEOUS) ×2 IMPLANT
CLSR STERI-STRIP ANTIMIC 1/2X4 (GAUZE/BANDAGES/DRESSINGS) IMPLANT
COMP FEM PS KNEE NRW 8 RT (Joint) ×1 IMPLANT
COMP PATELLA 3 PEG 29X9 (Joint) ×1 IMPLANT
COMPONENT FEM PS KNEE NRW 8 RT (Joint) IMPLANT
COMPONENT PATELLA 3 PEG 29X9 (Joint) IMPLANT
COVER SURGICAL LIGHT HANDLE (MISCELLANEOUS) ×1 IMPLANT
CUFF TRNQT CYL 34X4.125X (TOURNIQUET CUFF) ×1 IMPLANT
DERMABOND ADVANCED .7 DNX12 (GAUZE/BANDAGES/DRESSINGS) ×1 IMPLANT
DRAPE INCISE IOBAN 85X60 (DRAPES) ×1 IMPLANT
DRAPE SHEET LG 3/4 BI-LAMINATE (DRAPES) ×1 IMPLANT
DRAPE U-SHAPE 47X51 STRL (DRAPES) ×1 IMPLANT
DRSG AQUACEL AG ADV 3.5X10 (GAUZE/BANDAGES/DRESSINGS) ×1 IMPLANT
ELECT REM PT RETURN 15FT ADLT (MISCELLANEOUS) ×1 IMPLANT
GAUZE SPONGE 4X4 12PLY STRL (GAUZE/BANDAGES/DRESSINGS) ×1 IMPLANT
GLOVE BIO SURGEON STRL SZ 6.5 (GLOVE) ×2 IMPLANT
GLOVE BIOGEL PI IND STRL 6.5 (GLOVE) ×1 IMPLANT
GLOVE BIOGEL PI IND STRL 8 (GLOVE) ×1 IMPLANT
GLOVE SURG ORTHO 8.0 STRL STRW (GLOVE) ×2 IMPLANT
GOWN STRL REUS W/ TWL XL LVL3 (GOWN DISPOSABLE) ×2 IMPLANT
HOLDER FOLEY CATH W/STRAP (MISCELLANEOUS) ×1 IMPLANT
HOOD PEEL AWAY T7 (MISCELLANEOUS) ×3 IMPLANT
IMMOBILIZER KNEE 20 (SOFTGOODS) ×1 IMPLANT
IMMOBILIZER KNEE 20 THIGH 36 (SOFTGOODS) IMPLANT
INSERT TIB PERS SZ 8-9X13 RT (Insert) IMPLANT
KIT TURNOVER KIT A (KITS) IMPLANT
MANIFOLD NEPTUNE II (INSTRUMENTS) ×1 IMPLANT
MARKER SKIN DUAL TIP RULER LAB (MISCELLANEOUS) ×1 IMPLANT
NS IRRIG 1000ML POUR BTL (IV SOLUTION) ×1 IMPLANT
PACK TOTAL KNEE CUSTOM (KITS) ×1 IMPLANT
PIN DRILL HDLS TROCAR 75 4PK (PIN) IMPLANT
SCREW HEADED 33MM KNEE (MISCELLANEOUS) IMPLANT
SET HNDPC FAN SPRY TIP SCT (DISPOSABLE) ×1 IMPLANT
SOLUTION IRRIG SURGIPHOR (IV SOLUTION) IMPLANT
STEM TIB PS KNEE D 0D RT (Stem) IMPLANT
STRIP CLOSURE SKIN 1/2X4 (GAUZE/BANDAGES/DRESSINGS) ×1 IMPLANT
SUT MNCRL AB 3-0 PS2 18 (SUTURE) ×1 IMPLANT
SUT STRATAFIX 0 PDS 27 VIOLET (SUTURE) ×1 IMPLANT
SUT STRATAFIX 14 PDO 48 VLT (SUTURE) ×1 IMPLANT
SUT STRATAFIX PDO 1 14 VIOLET (SUTURE) ×1 IMPLANT
SUT VIC AB 2-0 CT2 27 (SUTURE) ×2 IMPLANT
SUTURE STRATFX 0 PDS 27 VIOLET (SUTURE) ×1 IMPLANT
SYR 50ML LL SCALE MARK (SYRINGE) ×1 IMPLANT
TRAY FOLEY MTR SLVR 14FR STAT (SET/KITS/TRAYS/PACK) IMPLANT
TUBE SUCTION HIGH CAP CLEAR NV (SUCTIONS) ×1 IMPLANT
UNDERPAD 30X36 HEAVY ABSORB (UNDERPADS AND DIAPERS) ×1 IMPLANT
WRAP KNEE MAXI GEL POST OP (GAUZE/BANDAGES/DRESSINGS) ×1 IMPLANT

## 2023-08-04 NOTE — Anesthesia Postprocedure Evaluation (Signed)
 Anesthesia Post Note  Patient: Colleen Kemp  Procedure(s) Performed: ARTHROPLASTY, KNEE, TOTAL (Right: Knee)     Patient location during evaluation: Nursing Unit Anesthesia Type: Regional and Spinal Level of consciousness: oriented and awake and alert Pain management: pain level controlled Vital Signs Assessment: post-procedure vital signs reviewed and stable Respiratory status: spontaneous breathing and respiratory function stable Cardiovascular status: blood pressure returned to baseline and stable Postop Assessment: no headache, no backache, no apparent nausea or vomiting and patient able to bend at knees Anesthetic complications: no  No notable events documented.  Last Vitals:  Vitals:   08/04/23 1051 08/04/23 1100  BP:  102/63  Pulse: 84 90  Resp:    Temp:    SpO2: 99% 97%    Last Pain:  Vitals:   08/04/23 1050  TempSrc:   PainSc: 2                  Trevor Iha

## 2023-08-04 NOTE — Discharge Instructions (Signed)

## 2023-08-04 NOTE — Transfer of Care (Signed)
 Immediate Anesthesia Transfer of Care Note  Patient: Colleen Kemp  Procedure(s) Performed: ARTHROPLASTY, KNEE, TOTAL (Right: Knee)  Patient Location: PACU  Anesthesia Type:Spinal  Level of Consciousness: awake, alert , and oriented  Airway & Oxygen Therapy: Patient Spontanous Breathing and Patient connected to face mask oxygen  Post-op Assessment: Report given to RN and Post -op Vital signs reviewed and stable  Post vital signs: Reviewed and stable  Last Vitals:  Vitals Value Taken Time  BP 116/75 08/04/23 0938  Temp    Pulse 92 08/04/23 0940  Resp 15 08/04/23 0940  SpO2 100 % 08/04/23 0940  Vitals shown include unfiled device data.  Last Pain:  Vitals:   08/04/23 0623  TempSrc:   PainSc: 0-No pain         Complications: No notable events documented.

## 2023-08-04 NOTE — Op Note (Signed)
 DATE OF SURGERY:  08/04/2023 TIME: 9:01 AM  PATIENT NAME:  Colleen Kemp   AGE: 58 y.o.    PRE-OPERATIVE DIAGNOSIS: End-stage right knee osteoarthritis  POST-OPERATIVE DIAGNOSIS:  Same  PROCEDURE: Press-fit right total Knee Arthroplasty  SURGEON:  Rodriques Badie A Hayly Litsey, MD   ASSISTANT: Kathie Dike, PA-C, present and scrubbed throughout the case, critical for assistance with exposure, retraction, instrumentation, and closure.   OPERATIVE IMPLANTS:  [employer] size 8 narrow right femur press-fit, size D Osseo Ti tibial baseplate, 29 mm press-fit 3 peg patella, 13 mm MC poly Implant Name Type Inv. Item Serial No. Manufacturer Lot No. LRB No. Used Action  COMP PATELLA 3 PEG 29X9 - [id number] Joint COMP PATELLA 3 PEG 29X9  [employer](ORTH,TRAU,BIO,SG) [id number] Right 1 Implanted  COMP FEM PS KNEE NRW 8 RT - [id number] Joint COMP FEM PS KNEE NRW 8 RT  [employer](ORTH,TRAU,BIO,SG) [id number] Right 1 Implanted  STEM TIB PS KNEE D 0D RT - [id number] Stem STEM TIB PS KNEE D 0D RT  [employer](ORTH,TRAU,BIO,SG) [id number] Right 1 Implanted  INSERT TIB PERS SZ 8-9X13 RT - [id number] Insert INSERT TIB PERS SZ 8-9X13 RT  [employer](ORTH,TRAU,BIO,SG) [id number] Right 1 Implanted      PREOPERATIVE INDICATIONS:  Colleen Kemp is a 58 y.o. year old female with end stage bone on bone degenerative arthritis of the knee who failed conservative treatment, including injections, antiinflammatories, activity modification, and assistive devices, and had significant impairment of their activities of daily living, and elected for Total Knee Arthroplasty.   The risks, benefits, and alternatives were discussed at length including but not limited to the risks of infection, bleeding, nerve injury, stiffness, blood clots, the need for revision surgery, cardiopulmonary complications, among others, and they were willing to proceed.  ESTIMATED BLOOD LOSS: 50cc  OPERATIVE DESCRIPTION:   Once adequate  anesthesia was induced, preoperative antibiotics, 2 gm of ancef,1 gm of Tranexamic Acid, and 8 mg of Decadron administered, the patient was positioned supine with a right thigh tourniquet placed.  The right lower extremity was prepped and draped in sterile fashion.  A time-  out was performed identifying the patient, planned procedure, and the appropriate extremity.     The leg was  exsanguinated, tourniquet elevated to 250 mmHg.  A midline incision was  made followed by median parapatellar arthrotomy. Anterior horn of the medial meniscus was released and resected. A medial release was performed, the infrapatellar fat pad was resected with care taken to protect the patellar tendon. The suprapatellar fat was removed to exposed the distal anterior femur. The anterior horn of the lateral meniscus and ACL were released.    Following initial  exposure, I first started with the femur  The femoral  canal was opened with a drill, canal was suctioned to try to prevent fat emboli.  An  intramedullary rod was passed set at 5 degrees valgus, 10 mm. The distal femur was resected.  Following this resection, the tibia was  subluxated anteriorly.  Using the extramedullary guide, 10 mm of bone was resected off   the proximal lateral tibia.  We confirmed the gap would be  stable medially and laterally with a size 10mm spacer block as well as confirmed that the tibial cut was perpendicular in the coronal plane, checking with an alignment rod.    Once this was done, the posterior femoral referencing femoral sizer was placed under to the posterior condyles with 3 degrees of external rotational which was parallel to the transepicondylar  axis and perpendicular to [employer]. The femur was sized to be a size 8 in the anterior-  posterior dimension. The  anterior, posterior, and  chamfer cuts were made without difficulty nor   notching making certain that I was along the anterior cortex to help  with flexion gap stability. Next  a laminar spreader was placed with the knee in flexion and the medial lateral menisci were resected.  5 cc of the Exparel mixture was injected in the medial side of the back of the knee and 3 cc in the lateral side.  1/2 inch curved osteotome was used to resect posterior osteophyte that was then removed with a pituitary rongeur.       At this point, the tibia was sized to be a size D.  The size D tray was  then pinned in position. Trial reduction was now carried with a 8 femur, D tibia, a 10 mm MC insert.  There was laxity with the 10mm insert in extension upsized to the 13 mm insert which had good stability the knee had full extension and was stable to varus valgus stress in extension.  The knee was tight in flexion and the PCL was released.  Attention was next directed to the patella.  Precut  measurement was noted to be 19 mm.  I resected down to 13 mm and used a  29mm patellar button to restore patellar height as well as cover the cut surface.     The patella lug holes were drilled and a 29 mm patella poly trial was placed.    The knee was brought to full extension with good flexion stability with the patella tracking through the trochlea without application of pressure.    Next the femoral component was again assessed and determined to be seated and appropriately lateralized.  The femoral lug holes were drilled.  The femoral component was then removed. Tibial component was again assessed and felt to be seated and appropriately rotated with the medial third of the tubercle. The tibia was then drilled, and keel punched.     Final components were  opened and impacted into place.   The knee was irrigated with sterile Betadine diluted in saline as well as pulse lavage normal saline. The synovial lining was  then injected a dilute Exparel with 30cc of 0.25% marcaine with epinephrine.     I confirmed that I was satisfied with the range of motion and stability, and the final 13 mm MC poly insert was  chosen.  It was placed into the knee.         The tourniquet had been let down at 57 minutes.  No significant hemostasis was required.  The medial parapatellar arthrotomy was then reapproximated using #1 Stratafix sutures with the knee  in flexion.  The remaining wound was closed with 0 stratafix, 2-0 Vicryl, and running 3-0 Monocryl. The knee was cleaned, dried, dressed sterilely using Dermabond and   Aquacel dressing.  The patient was then brought to recovery room in stable condition, tolerating the procedure  well. There were no complications.   Post op recs: WB: WBAT Abx: ancef Imaging: PACU xrays DVT prophylaxis: Aspirin 81mg  BID x4 weeks Follow up: 2 weeks after surgery for a wound check with Dr. Blanchie Dessert at [facility].  Address: [address], [place], [place] [address]  Office Phone: [phone]  Weber Cooks, MD Orthopaedic Surgery

## 2023-08-04 NOTE — Anesthesia Procedure Notes (Signed)
 Procedure Name: MAC Date/Time: 08/04/2023 7:10 AM  Performed by: Cleda Clarks, CRNAPre-anesthesia Checklist: Patient identified, Emergency Drugs available, Suction available, Patient being monitored and Timeout performed Patient Re-evaluated:Patient Re-evaluated prior to induction Oxygen Delivery Method: Simple face mask Placement Confirmation: positive ETCO2

## 2023-08-04 NOTE — Interval H&P Note (Signed)

## 2023-08-04 NOTE — Anesthesia Procedure Notes (Addendum)
 Spinal  Patient location during procedure: OB Start time: 08/04/2023 7:13 AM End time: 08/04/2023 7:17 AM Reason for block: surgical anesthesia Staffing Performed: anesthesiologist  Anesthesiologist: Trevor Iha, MD Performed by: Trevor Iha, MD Authorized by: Trevor Iha, MD   Preanesthetic Checklist Completed: patient identified, IV checked, risks and benefits discussed, surgical consent, monitors and equipment checked, pre-op evaluation and timeout performed Spinal Block Patient position: sitting Prep: DuraPrep and site prepped and draped Patient monitoring: heart rate, cardiac monitor, continuous pulse ox and blood pressure Approach: midline Location: L3-4 Injection technique: single-shot Needle Needle type: Pencan  Needle gauge: 24 G Needle length: 10 cm Needle insertion depth: 7 cm Assessment Sensory level: T4 Events: CSF return Additional Notes  1 Attempt (s). Pt tolerated procedure well.

## 2023-08-04 NOTE — Progress Notes (Signed)
 Orthopedic Tech Progress Note Patient Details:  Colleen Kemp [birth year] [id number]  Ortho Devices Type of Ortho Device: Bone foam zero knee Ortho Device/Splint Location: RLE Ortho Device/Splint Interventions: Ordered, Application, Adjustment   Post Interventions Patient Tolerated: Well Instructions Provided: Care of device  [place] A Gerilyn Pilgrim 08/04/2023, 10:11 AM

## 2023-08-04 NOTE — Progress Notes (Signed)
 PT Cancellation Note  Patient Details Name: Colleen Kemp MRN: [id number] DOB: 05-19-1965   Cancelled Treatment:    Reason Eval/Treat Not Completed: Other (comment)  Checked on pt twice (1330 and 1515) but still with effects of adductor canal block with no active quad contraction.  MD cleared pt for activity with KI, but pt wants to wait a little longer.  Will f/u later today.  Anise Salvo, PT Acute Rehab [facility] [phone]  Rayetta Humphrey 08/04/2023, 4:08 PM

## 2023-08-04 NOTE — Evaluation (Signed)
 Physical Therapy Evaluation Patient Details Name: Colleen Kemp MRN: [id number] DOB: August 18, 1965 Today's Date: 08/04/2023  History of Present Illness  Pt is 58 yo female s/p R TKA on 08/04/23.  Pt with hx including but not limited to arthritis, HTN, and breast lumpectomy.  Clinical Impression  Pt is s/p TKA resulting in the deficits listed below (see PT Problem List). PT asked to see pt for possible SDDC. At baseline, pt is independent.  She does not have RW at home and will be staying with her elderly parents at d/c. Reports parents can provide supervision and assist with ADLs but little physical assist.  Today, pt still with effects of adductor canal block with knee/quad numb and no active quad contraction.  Per surgeon , ok for SDDC in KI if passes therapy.  Pt was able to ambulate 30'x2 with RW and CGA in KI without buckling and performed 1 step with min A to steady.  She did need min A to stand from toilet and used grab bar.  Pt reports that she still feels her L TKA is recovering and is worried about return home today with R knee still numb and parents unable to assist.  PT agrees, pt would benefit from further therapy prior to d/c.  Pt will benefit from acute skilled PT to increase their independence and safety with mobility to allow discharge.          If plan is discharge home, recommend the following: A little help with walking and/or transfers;A little help with bathing/dressing/bathroom;Assistance with cooking/housework;Help with stairs or ramp for entrance   Can travel by private vehicle        Equipment Recommendations Rolling walker (2 wheels)  Recommendations for Other Services       Functional Status Assessment Patient has had a recent decline in their functional status and demonstrates the ability to make significant improvements in function in a reasonable and predictable amount of time.     Precautions / Restrictions Precautions Precautions: Fall;Knee Required Braces  or Orthoses: Knee Immobilizer - Right Knee Immobilizer - Right: Discontinue once straight leg raise with < 10 degree lag Restrictions Weight Bearing Restrictions Per Provider Order: Yes RLE Weight Bearing Per Provider Order: Weight bearing as tolerated      Mobility  Bed Mobility Overal bed mobility: Needs Assistance Bed Mobility: Supine to Sit, Sit to Supine     Supine to sit: Min assist Sit to supine: Min assist   General bed mobility comments: Min A for R LE    Transfers Overall transfer level: Needs assistance Equipment used: Rolling walker (2 wheels) Transfers: Sit to/from Stand Sit to Stand: Min assist, Contact guard assist           General transfer comment: CGA from elevated bed ; min A and increased time from toilet with grab bars; cues to come up on L LE    Ambulation/Gait Ambulation/Gait assistance: Contact guard assist Gait Distance (Feet): 30 Feet (30'x2) Assistive device: Rolling walker (2 wheels) Gait Pattern/deviations: Step-to pattern, Decreased weight shift to right Gait velocity: decreased     General Gait Details: Educated on step to pattern; utilized KI  Stairs Stairs: Yes Stairs assistance: Min assist Stair Management: Step to pattern, Backwards Number of Stairs: 1 General stair comments: Up 1 4" step to simulate threshold; min A to steady  Wheelchair Mobility     Tilt Bed    Modified Rankin (Stroke Patients Only)       Balance Overall balance assessment:  Needs assistance Sitting-balance support: No upper extremity supported Sitting balance-Leahy Scale: Good     Standing balance support: Bilateral upper extremity supported, Reliant on assistive device for balance Standing balance-Leahy Scale: Poor Standing balance comment: Steady but wtih RW                             Pertinent Vitals/Pain Pain Assessment Pain Assessment: No/denies pain    Home Living Family/patient expects to be discharged to:: Private  residence Living Arrangements: Other (Comment) (LIves with significant other but plans to stay with parents at d/c) Available Help at Discharge: Family;Available 24 hours/day Type of Home: House Home Access: Stairs to enter   [employer] of Steps: Just threshold   Home Layout: One level Home Equipment: Grab bars - tub/shower;Shower seat      Prior Function Prior Level of Function : Independent/Modified Independent;Driving             Mobility Comments: Could ambulate in community       Extremity/Trunk Assessment   Upper Extremity Assessment Upper Extremity Assessment: Overall WFL for tasks assessed    Lower Extremity Assessment Lower Extremity Assessment: LLE deficits/detail;RLE deficits/detail RLE Deficits / Details: Expected post op changes; ROM Knee 5 to 80 degrees; MMT: ankle 5/5, knee ext 0/5, hip 3/5; Pt still wtih numbness in knee and quad with no active quad contraction from adductor canal block.  Per surgeon ok to try with KI. LLE Deficits / Details: Recent L TKA (8/24) and reports that she is still recovery.  ROM Was WFL, MMT grossly 5/5 throughout LLE Sensation: WNL (No further effects of spinal)    Cervical / Trunk Assessment Cervical / Trunk Assessment: Normal  Communication        Cognition Arousal: Alert Behavior During Therapy: WFL for tasks assessed/performed   PT - Cognitive impairments: No apparent impairments                                 Cueing       General Comments      Exercises     Assessment/Plan    PT Assessment Patient needs continued PT services  PT Problem List Decreased strength;Pain;Decreased range of motion;Impaired sensation;Decreased activity tolerance;Decreased knowledge of use of DME;Decreased balance;Decreased mobility       PT Treatment Interventions Therapeutic exercise;DME instruction;Gait training;Stair training;Functional mobility training;Therapeutic activities;Patient/family  education;Balance training    PT Goals (Current goals can be found in the Care Plan section)  Acute Rehab PT Goals Patient Stated Goal: return home PT Goal Formulation: With patient/family Time For Goal Achievement: 08/18/23 Potential to Achieve Goals: Good    Frequency 7X/week     Co-evaluation               AM-PAC PT "6 Clicks" Mobility  Outcome Measure Help needed turning from your back to your side while in a flat bed without using bedrails?: A Little Help needed moving from lying on your back to sitting on the side of a flat bed without using bedrails?: A Little Help needed moving to and from a bed to a chair (including a wheelchair)?: A Little Help needed standing up from a chair using your arms (e.g., wheelchair or bedside chair)?: A Little Help needed to walk in hospital room?: A Little Help needed climbing 3-5 steps with a railing? : A Little 6 Click Score: 18  End of Session Equipment Utilized During Treatment: Gait belt;Right knee immobilizer Activity Tolerance: Patient tolerated treatment well Patient left: in bed (In PACU) Nurse Communication: Mobility status PT Visit Diagnosis: Other abnormalities of gait and mobility (R26.89);Muscle weakness (generalized) (M62.81)    Time: 4401-0272 PT Time Calculation (min) (ACUTE ONLY): 30 min   Charges:   PT Evaluation $PT Eval Low Complexity: 1 Low PT Treatments $Gait Training: 8-22 mins PT General Charges $$ ACUTE PT VISIT: 1 Visit         Anise Salvo, PT Acute Rehab [facility] [phone]   Rayetta Humphrey 08/04/2023, 5:41 PM

## 2023-08-04 NOTE — Anesthesia Procedure Notes (Signed)
 Anesthesia Regional Block: Adductor canal block   Pre-Anesthetic Checklist: , timeout performed,  Correct Patient, Correct Site, Correct Laterality,  Correct Procedure, Correct Position, site marked,  Risks and benefits discussed,  Surgical consent,  Pre-op evaluation,  At surgeon's request and post-op pain management  Laterality: Lower and Right  Prep: chloraprep       Needles:  Injection technique: Single-shot  Needle Type: Echogenic Needle     Needle Length: 9cm  Needle Gauge: 22     Additional Needles:   Procedures:,,,, ultrasound used (permanent image in chart),,    Narrative:  Start time: 08/04/2023 6:50 AM End time: 08/04/2023 6:55 AM Injection made incrementally with aspirations every 5 mL.  Performed by: Personally  Anesthesiologist: Trevor Iha, MD  Additional Notes: Block assessed prior to surgery. Pt tolerated procedure well.

## 2023-08-05 ENCOUNTER — Encounter (HOSPITAL_COMMUNITY): Payer: Self-pay | Admitting: Orthopedic Surgery

## 2023-08-05 DIAGNOSIS — M1711 Unilateral primary osteoarthritis, right knee: Secondary | ICD-10-CM | POA: Diagnosis not present

## 2023-08-05 LAB — CBC
HCT: 34.4 % — ABNORMAL LOW (ref 36.0–46.0)
Hemoglobin: 11.1 g/dL — ABNORMAL LOW (ref 12.0–15.0)
MCH: 28.6 pg (ref 26.0–34.0)
MCHC: 32.3 g/dL (ref 30.0–36.0)
MCV: 88.7 fL (ref 80.0–100.0)
Platelets: 235 10*3/uL (ref 150–400)
RBC: 3.88 MIL/uL (ref 3.87–5.11)
RDW: 14 % (ref 11.5–15.5)
WBC: 13.1 10*3/uL — ABNORMAL HIGH (ref 4.0–10.5)
nRBC: 0 % (ref 0.0–0.2)

## 2023-08-05 LAB — BASIC METABOLIC PANEL WITH GFR
Anion gap: 6 (ref 5–15)
BUN: 23 mg/dL — ABNORMAL HIGH (ref 6–20)
CO2: 25 mmol/L (ref 22–32)
Calcium: 8.9 mg/dL (ref 8.9–10.3)
Chloride: 105 mmol/L (ref 98–111)
Creatinine, Ser: 1.35 mg/dL — ABNORMAL HIGH (ref 0.44–1.00)
GFR, Estimated: 46 mL/min — ABNORMAL LOW (ref >60)
Glucose, Bld: 187 mg/dL — ABNORMAL HIGH (ref 70–99)
Potassium: 3.5 mmol/L (ref 3.5–5.1)
Sodium: 136 mmol/L (ref 135–145)

## 2023-08-05 LAB — GLUCOSE, CAPILLARY
Glucose-Capillary: 142 mg/dL — ABNORMAL HIGH (ref 70–99)
Glucose-Capillary: 111 mg/dL — ABNORMAL HIGH (ref 70–99)

## 2023-08-05 NOTE — Plan of Care (Signed)
  Problem: Education: Goal: Knowledge of General Education information will improve Description: Including pain rating scale, medication(s)/side effects and non-pharmacologic comfort measures Outcome: Progressing   Problem: Health Behavior/Discharge Planning: Goal: Ability to manage health-related needs will improve Outcome: Progressing   Problem: Clinical Measurements: Goal: Ability to maintain clinical measurements within normal limits will improve Outcome: Progressing Goal: Will remain free from infection Outcome: Progressing Goal: Diagnostic test results will improve Outcome: Progressing Goal: Respiratory complications will improve Outcome: Progressing Goal: Cardiovascular complication will be avoided Outcome: Progressing   Problem: Activity: Goal: Risk for activity intolerance will decrease Outcome: Adequate for Discharge   Problem: Nutrition: Goal: Adequate nutrition will be maintained Outcome: Completed/Met   Problem: Coping: Goal: Level of anxiety will decrease Outcome: Progressing   Problem: Elimination: Goal: Will not experience complications related to bowel motility Outcome: Progressing Goal: Will not experience complications related to urinary retention Outcome: Completed/Met   Problem: Pain Managment: Goal: General experience of comfort will improve and/or be controlled Outcome: Progressing   Problem: Safety: Goal: Ability to remain free from injury will improve Outcome: Progressing   Problem: Skin Integrity: Goal: Risk for impaired skin integrity will decrease Outcome: Adequate for Discharge   Problem: Education: Goal: Knowledge of the prescribed therapeutic regimen will improve Outcome: Progressing Goal: Individualized Educational Video(s) Outcome: Completed/Met   Problem: Activity: Goal: Ability to avoid complications of mobility impairment will improve Outcome: Progressing Goal: Range of joint motion will improve Outcome: Adequate for  Discharge   Problem: Clinical Measurements: Goal: Postoperative complications will be avoided or minimized Outcome: Progressing   Problem: Pain Management: Goal: Pain level will decrease with appropriate interventions Outcome: Progressing   Problem: Skin Integrity: Goal: Will show signs of wound healing Outcome: Progressing

## 2023-08-05 NOTE — TOC Transition Note (Signed)
 Transition of Care [facility]) - Discharge Note   Patient Details  Name: Colleen Kemp MRN: [id number] Date of Birth: [birth year]  Transition of [facility]) CM/SW Contact:  Amada Jupiter, LCSW Phone Number: 08/05/2023, 10:11 AM   Clinical Narrative:     Met with pt who confirms she has needed DME in the home.  OPPT already arranged via workers' comp CM.  No further TOC needs.  Final next level of care: OP Rehab Barriers to Discharge: No Barriers Identified   Patient Goals and CMS Choice Patient states their goals for this hospitalization and ongoing recovery are:: return home          Discharge Placement                       Discharge Plan and Services Additional resources added to the After Visit Summary for                  DME Arranged: N/A DME Agency: NA                  Social Drivers of Health (SDOH) Interventions SDOH Screenings   Food Insecurity: No Food Insecurity (08/04/2023)  Housing: Low Risk  (08/04/2023)  Transportation Needs: No Transportation Needs (08/04/2023)  Utilities: Not At Risk (08/04/2023)  Depression (PHQ2-9): Medium Risk (06/08/2019)  Financial Resource Strain: Low Risk  (05/12/2023)   Received from [facility]  Physical Activity: Insufficiently Active (02/23/2023)   Received from [facility]  Social Connections: Moderately Integrated (02/23/2023)   Received from [facility]  Stress: Stress Concern Present (02/23/2023)   Received from [facility]  Tobacco Use: Low Risk  (08/04/2023)     Readmission Risk Interventions     No data to display

## 2023-08-05 NOTE — Progress Notes (Signed)
Discharge instructions given to patient questions ask and answered.  D Pasqual Farias RN 

## 2023-08-05 NOTE — Progress Notes (Signed)
     Subjective:  Patient reports pain as mild.  Had to be admitted overnight because of postop quad weakness.  Strength is improved today.  Plan for mobilization with therapy anticipate discharge home. Denies distal n/t.  Objective:   VITALS:   Vitals:   08/04/23 1745 08/04/23 2147 08/05/23 0153 08/05/23 0603  BP: 114/69 109/69 109/63 119/76  Pulse: 82 85 88 88  Resp:  18 18 18   Temp: 97.9 F (36.6 C) 98.4 F (36.9 C) 97.8 F (36.6 C) 97.8 F (36.6 C)  TempSrc:  Oral    SpO2: 97% 98% 97% 100%  Weight:      Height:        Neurovascular intact Sensation intact distally Intact pulses distally Dorsiflexion/Plantar flexion intact Incision: dressing C/D/I Compartment soft    Lab Results  Component Value Date   WBC 13.1 (H) 08/05/2023   HGB 11.1 (L) 08/05/2023   HCT 34.4 (L) 08/05/2023   MCV 88.7 08/05/2023   PLT 235 08/05/2023   BMET    Component Value Date/Time   NA 136 08/05/2023 0344   NA 140 06/08/2019 1308   K 3.5 08/05/2023 0344   CL 105 08/05/2023 0344   CO2 25 08/05/2023 0344   GLUCOSE 187 (H) 08/05/2023 0344   BUN 23 (H) 08/05/2023 0344   BUN 21 06/08/2019 1308   CREATININE 1.35 (H) 08/05/2023 0344   CALCIUM 8.9 08/05/2023 0344   GFRNONAA 46 (L) 08/05/2023 0344      Xray: TKA components good position no adverse features  Assessment/Plan: 1 Day Post-Op   Principal Problem:   Primary osteoarthritis of right knee  S/p right TKA 08/04/2023  Post op recs: WB: WBAT Abx: ancef Imaging: PACU xrays DVT prophylaxis: Aspirin 81mg  BID x4 weeks Follow up: 2 weeks after surgery for a wound check with Dr. Blanchie Dessert at [facility].  Address: [address], [place], [place] [address]  Office Phone: [phone]      Joen Laura 08/05/2023, 6:21 AM   Weber Cooks, MD  Contact information:   [phone] 7am-5pm epic message Dr. Blanchie Dessert, or call office for patient follow up: [phone] After hours and  holidays please check [email or web address] for group call information for [facility]

## 2023-08-05 NOTE — Discharge Summary (Signed)
 Physician Discharge Summary  Patient ID: Colleen Kemp MRN: [id number] DOB/AGE: 02-01-1966 58 y.o.  Admit date: 08/04/2023 Discharge date: 08/05/2023  Admission Diagnoses:  Primary osteoarthritis of right knee  Discharge Diagnoses:  Principal Problem:   Primary osteoarthritis of right knee   Past Medical History:  Diagnosis Date   Arthritis    oa both knees   Chronic kidney disease    GERD (gastroesophageal reflux disease)    History of kidney stones    Hypertension    Hypothyroidism    Pneumonia    Pre-diabetes    Sleep apnea    no CPAP   Thyroid disease     Surgeries: Procedure(s): ARTHROPLASTY, KNEE, TOTAL on 08/04/2023   Consultants (if any):   Discharged Condition: Improved  Hospital Course: Colleen Kemp is an 58 y.o. female who was admitted 08/04/2023 with a diagnosis of Primary osteoarthritis of right knee and went to the operating room on 08/04/2023 and underwent the above named procedures.    She was given perioperative antibiotics:  Anti-infectives (From admission, onward)    Start     Dose/Rate Route Frequency Ordered Stop   08/04/23 1716  ceFAZolin (ANCEF) 2-4 GM/100ML-% IVPB       Note to Pharmacy: Norville Haggard C: cabinet override      08/04/23 1716 08/05/23 0529   08/04/23 1400  ceFAZolin (ANCEF) IVPB 2g/100 mL premix        2 g 200 mL/hr over 30 Minutes Intravenous Every 6 hours 08/04/23 0943 08/04/23 2316   08/04/23 0600  ceFAZolin (ANCEF) IVPB 2g/100 mL premix        2 g 200 mL/hr over 30 Minutes Intravenous On call to O.R. 08/04/23 6213 08/04/23 0725     .  She was given sequential compression devices, early ambulation, and aspirin for DVT prophylaxis.  She benefited maximally from the hospital stay and there were no complications.    Recent vital signs:  Vitals:   08/05/23 0153 08/05/23 0603  BP: 109/63 119/76  Pulse: 88 88  Resp: 18 18  Temp: 97.8 F (36.6 C) 97.8 F (36.6 C)  SpO2: 97% 100%    Recent laboratory studies:   Lab Results  Component Value Date   HGB 11.1 (L) 08/05/2023   HGB 13.8 07/24/2023   HGB 13.0 04/08/2015   Lab Results  Component Value Date   WBC 13.1 (H) 08/05/2023   PLT 235 08/05/2023   No results found for: "INR" Lab Results  Component Value Date   NA 136 08/05/2023   K 3.5 08/05/2023   CL 105 08/05/2023   CO2 25 08/05/2023   BUN 23 (H) 08/05/2023   CREATININE 1.35 (H) 08/05/2023   GLUCOSE 187 (H) 08/05/2023    Discharge Medications:   Allergies as of 08/05/2023       Reactions   Tramadol Nausea And Vomiting   Needs to take with food         Medication List     TAKE these medications    acetaminophen 500 MG tablet Commonly known as: TYLENOL Take 2 tablets (1,000 mg total) by mouth every 8 (eight) hours as needed.   aspirin EC 81 MG tablet Take 1 tablet (81 mg total) by mouth 2 (two) times daily for 28 days. Swallow whole.   atorvastatin 10 MG tablet Commonly known as: LIPITOR Take 10 mg by mouth daily.   celecoxib 100 MG capsule Commonly known as: CeleBREX Take 1 capsule (100 mg total) by mouth 2 (two)  times daily for 14 days.   esomeprazole 20 MG capsule Commonly known as: NEXIUM Take 20 mg by mouth daily at 12 noon.   hydrochlorothiazide 25 MG tablet Commonly known as: HYDRODIURIL Take 1 tablet (25 mg total) by mouth daily.   levothyroxine 125 MCG tablet Commonly known as: SYNTHROID Take 1 tablet (125 mcg total) by mouth daily.   losartan 50 MG tablet Commonly known as: COZAAR Take 50 mg by mouth daily.   methocarbamol 500 MG tablet Commonly known as: ROBAXIN Take 1 tablet (500 mg total) by mouth every 8 (eight) hours as needed for up to 10 days for muscle spasms.   Multivitamin & Mineral Liqd Take 15 mLs by mouth daily.   ondansetron 4 MG tablet Commonly known as: Zofran Take 1 tablet (4 mg total) by mouth every 8 (eight) hours as needed for up to 14 days for nausea or vomiting.   oxyCODONE 5 MG immediate release tablet Commonly  known as: Roxicodone Take 0.5-1 tablets (2.5-5 mg total) by mouth every 6 (six) hours as needed for up to 7 days for severe pain (pain score 7-10) or moderate pain (pain score 4-6).   Ozempic (2 MG/DOSE) 8 MG/3ML Sopn Generic drug: Semaglutide (2 MG/DOSE) Inject 2 mg into the skin once a week.   polyethylene glycol 17 g packet Commonly known as: MiraLax Take 17 g by mouth daily.   Vitamin D3 250 MCG (10000 UT) capsule Take 10,000 Units by mouth daily.        Diagnostic Studies: DG Knee Right Port Result Date: 08/04/2023 CLINICAL DATA:  Postop EXAM: PORTABLE RIGHT KNEE - 2 VIEW COMPARISON:  Preop x-ray 08/22/2022 FINDINGS: Total knee arthroplasty with Press-Fit femoral tibial component. Patellar button. Soft tissue gas identified. Expected alignment. No fracture or dislocation. Imaging was obtained to aid in treatment. IMPRESSION: Acute surgical changes of total knee arthroplasty. Electronically Signed   By: Karen Kays M.D.   On: 08/04/2023 13:40    Disposition: Discharge disposition: 01-Home or Self Care       Discharge Instructions     Call MD / Call 911   Complete by: As directed    If you experience chest pain or shortness of breath, CALL 911 and be transported to the hospital emergency room.  If you develope a fever above 101 F, pus (white drainage) or increased drainage or redness at the wound, or calf pain, call your surgeon's office.   Call MD / Call 911   Complete by: As directed    If you experience chest pain or shortness of breath, CALL 911 and be transported to the hospital emergency room.  If you develope a fever above 101 F, pus (white drainage) or increased drainage or redness at the wound, or calf pain, call your surgeon's office.   Constipation Prevention   Complete by: As directed    Drink plenty of fluids.  Prune juice may be helpful.  You may use a stool softener, such as Colace (over the counter) 100 mg twice a day.  Use MiraLax (over the counter) for  constipation as needed.   Constipation Prevention   Complete by: As directed    Drink plenty of fluids.  Prune juice may be helpful.  You may use a stool softener, such as Colace (over the counter) 100 mg twice a day.  Use MiraLax (over the counter) for constipation as needed.   Diet - low sodium heart healthy   Complete by: As directed    Diet -  low sodium heart healthy   Complete by: As directed    Do not put a pillow under the knee. Place it under the heel.   Complete by: As directed    Do not put a pillow under the knee. Place it under the heel.   Complete by: As directed    Driving restrictions   Complete by: As directed    No driving for 4-6 weeks   Driving restrictions   Complete by: As directed    No driving for 4-6 weeks   Increase activity slowly as tolerated   Complete by: As directed    Increase activity slowly as tolerated   Complete by: As directed    Post-operative opioid taper instructions:   Complete by: As directed    POST-OPERATIVE OPIOID TAPER INSTRUCTIONS: It is important to wean off of your opioid medication as soon as possible. If you do not need pain medication after your surgery it is ok to stop day one. Opioids include: Codeine, Hydrocodone(Norco, Vicodin), Oxycodone(Percocet, oxycontin) and hydromorphone amongst others.  Long term and even short term use of opiods can cause: Increased pain response Dependence Constipation Depression Respiratory depression And more.  Withdrawal symptoms can include Flu like symptoms Nausea, vomiting And more Techniques to manage these symptoms Hydrate well Eat regular healthy meals Stay active Use relaxation techniques(deep breathing, meditating, yoga) Do Not substitute Alcohol to help with tapering If you have been on opioids for less than two weeks and do not have pain than it is ok to stop all together.  Plan to wean off of opioids This plan should start within one week post op of your joint  replacement. Maintain the same interval or time between taking each dose and first decrease the dose.  Cut the total daily intake of opioids by one tablet each day Next start to increase the time between doses. The last dose that should be eliminated is the evening dose.      Post-operative opioid taper instructions:   Complete by: As directed    POST-OPERATIVE OPIOID TAPER INSTRUCTIONS: It is important to wean off of your opioid medication as soon as possible. If you do not need pain medication after your surgery it is ok to stop day one. Opioids include: Codeine, Hydrocodone(Norco, Vicodin), Oxycodone(Percocet, oxycontin) and hydromorphone amongst others.  Long term and even short term use of opiods can cause: Increased pain response Dependence Constipation Depression Respiratory depression And more.  Withdrawal symptoms can include Flu like symptoms Nausea, vomiting And more Techniques to manage these symptoms Hydrate well Eat regular healthy meals Stay active Use relaxation techniques(deep breathing, meditating, yoga) Do Not substitute Alcohol to help with tapering If you have been on opioids for less than two weeks and do not have pain than it is ok to stop all together.  Plan to wean off of opioids This plan should start within one week post op of your joint replacement. Maintain the same interval or time between taking each dose and first decrease the dose.  Cut the total daily intake of opioids by one tablet each day Next start to increase the time between doses. The last dose that should be eliminated is the evening dose.      TED hose   Complete by: As directed    Use stockings (TED hose) for 2 weeks on both leg(s).  Then for 2 more weeks on the surgical leg.  You may remove them at night for sleeping.   TED hose  Complete by: As directed    Use stockings (TED hose) for 2 weeks on both leg(s).  Then for 2 more weeks on the surgical leg.  You may remove them at  night for sleeping.        Follow-up Information     Joen Laura, MD Follow up in 2 week(s).   Specialty: Orthopedic Surgery Contact information: [address] [place] [address] [phone]                    Discharge Instructions      INSTRUCTIONS AFTER JOINT REPLACEMENT   Remove items at home which could result in a fall. This includes throw rugs or furniture in walking pathways ICE to the affected joint every three hours while awake for 30 minutes at a time, for at least the first 3-5 days, and then as needed for pain and swelling.  Continue to use ice for pain and swelling. You may notice swelling that will progress down to the foot and ankle.  This is normal after surgery.  Elevate your leg when you are not up walking on it.   Continue to use the breathing machine you got in the hospital (incentive spirometer) which will help keep your temperature down.  It is common for your temperature to cycle up and down following surgery, especially at night when you are not up moving around and exerting yourself.  The breathing machine keeps your lungs expanded and your temperature down.  DIET:  As you were doing prior to hospitalization, we recommend a well-balanced diet.  DRESSING / WOUND CARE / SHOWERING:  Keep the surgical dressing until follow up.  The dressing is water proof, so you can shower without any extra covering.  IF THE DRESSING FALLS OFF or the wound gets wet inside, change the dressing with sterile gauze.  Please use good hand washing techniques before changing the dressing.  Do not use any lotions or creams on the incision until instructed by your surgeon.    ACTIVITY  Increase activity slowly as tolerated, but follow the weight bearing instructions below.   No driving for 6 weeks or until further direction given by your physician.  You cannot drive while taking narcotics.  No lifting or carrying greater than 10 lbs. until further  directed by your surgeon. Avoid periods of inactivity such as sitting longer than an hour when not asleep. This helps prevent blood clots.  You may return to work once you are authorized by your doctor.   WEIGHT BEARING: Weight bearing as tolerated with assist device (walker, cane, etc) as directed, use it as long as suggested by your surgeon or therapist, typically at least 4-6 weeks.  EXERCISES  Results after joint replacement surgery are often greatly improved when you follow the exercise, range of motion and muscle strengthening exercises prescribed by your doctor. Safety measures are also important to protect the joint from further injury. Any time any of these exercises cause you to have increased pain or swelling, decrease what you are doing until you are comfortable again and then slowly increase them. If you have problems or questions, call your caregiver or physical therapist for advice.   Rehabilitation is important following a joint replacement. After just a few days of immobilization, the muscles of the leg can become weakened and shrink (atrophy).  These exercises are designed to build up the tone and strength of the thigh and leg muscles and to improve motion. Often  times heat used for twenty to thirty minutes before working out will loosen up your tissues and help with improving the range of motion but do not use heat for the first two weeks following surgery (sometimes heat can increase post-operative swelling).   These exercises can be done on a training (exercise) mat, on the floor, on a table or on a bed. Use whatever works the best and is most comfortable for you.    Use music or television while you are exercising so that the exercises are a pleasant break in your day. This will make your life better with the exercises acting as a break in your routine that you can look forward to.   Perform all exercises about fifteen times, three times per day or as directed.  You should exercise  both the operative leg and the other leg as well.  Exercises include:   Quad Sets - Tighten up the muscle on the front of the thigh (Quad) and hold for 5-10 seconds.   Straight Leg Raises - With your knee straight (if you were given a brace, keep it on), lift the leg to 60 degrees, hold for 3 seconds, and slowly lower the leg.  Perform this exercise against resistance later as your leg gets stronger.  Leg Slides: Lying on your back, slowly slide your foot toward your buttocks, bending your knee up off the floor (only go as far as is comfortable). Then slowly slide your foot back down until your leg is flat on the floor again.  Angel Wings: Lying on your back spread your legs to the side as far apart as you can without causing discomfort.  Hamstring Strength:  Lying on your back, push your heel against the floor with your leg straight by tightening up the muscles of your buttocks.  Repeat, but this time bend your knee to a comfortable angle, and push your heel against the floor.  You may put a pillow under the heel to make it more comfortable if necessary.   A rehabilitation program following joint replacement surgery can speed recovery and prevent re-injury in the future due to weakened muscles. Contact your doctor or a physical therapist for more information on knee rehabilitation.   CONSTIPATION:  Constipation is defined medically as fewer than three stools per week and severe constipation as less than one stool per week.  Even if you have a regular bowel pattern at home, your normal regimen is likely to be disrupted due to multiple reasons following surgery.  Combination of anesthesia, postoperative narcotics, change in appetite and fluid intake all can affect your bowels.   YOU MUST use at least one of the following options; they are listed in order of increasing strength to get the job done.  They are all available over the counter, and you may need to use some, POSSIBLY even all of these options:     Drink plenty of fluids (prune juice may be helpful) and high fiber foods Colace 100 mg by mouth twice a day  Senokot for constipation as directed and as needed Dulcolax (bisacodyl), take with full glass of water  Miralax (polyethylene glycol) once or twice a day as needed.  If you have tried all these things and are unable to have a bowel movement in the first 3-4 days after surgery call either your surgeon or your primary doctor.    If you experience loose stools or diarrhea, hold the medications until you stool forms back up.  If your  symptoms do not get better within 1 week or if they get worse, check with your doctor.  If you experience "the worst abdominal pain ever" or develop nausea or vomiting, please contact the office immediately for further recommendations for treatment.  ITCHING:  If you experience itching with your medications, try taking only a single pain pill, or even half a pain pill at a time.  You can also use Benadryl over the counter for itching or also to help with sleep.   TED HOSE STOCKINGS:  Use stockings on both legs until for at least 2 weeks or as directed by physician office. They may be removed at night for sleeping.  MEDICATIONS:  See your medication summary on the "After Visit Summary" that nursing will review with you.  You may have some home medications which will be placed on hold until you complete the course of blood thinner medication.  It is important for you to complete the blood thinner medication as prescribed.  Blood clot prevention (DVT Prophylaxis): After surgery you are at an increased risk for a blood clot. you were prescribed a blood thinner, Aspirin 81mg , to be taken twice daily for a total of 4 weeks from surgery to help reduce your risk of getting a blood clot.  Signs of a pulmonary embolus (blood clot in the lungs) include sudden short of breath, feeling lightheaded or dizzy, chest pain with a deep breath, rapid pulse rapid breathing.  Signs of  a blood clot in your arms or legs include new unexplained swelling and cramping, warm, red or darkened skin around the painful area.  Please call the office or 911 right away if these signs or symptoms develop.  PRECAUTIONS:   If you experience chest pain or shortness of breath - call 911 immediately for transfer to the hospital emergency department.   If you develop a fever greater that 101 F, purulent drainage from wound, increased redness or drainage from wound, foul odor from the wound/dressing, or calf pain - CONTACT YOUR SURGEON.                                                   FOLLOW-UP APPOINTMENTS:  If you do not already have a post-op appointment, please call the office for an appointment to be seen by your surgeon.  Guidelines for how soon to be seen are listed in your "After Visit Summary", but are typically between 2-3 weeks after surgery.  If you have a specialized bandage, you may be told to follow up 1 week after surgery.  OTHER INSTRUCTIONS:  Knee Replacement:  Do not place pillow under knee, focus on keeping the knee straight while resting.  Place foam block, curve side up under heel at all times except when walking.  DO NOT modify, tear, cut, or change the foam block in any way.  POST-OPERATIVE OPIOID TAPER INSTRUCTIONS: It is important to wean off of your opioid medication as soon as possible. If you do not need pain medication after your surgery it is ok to stop day one. Opioids include: Codeine, Hydrocodone(Norco, Vicodin), Oxycodone(Percocet, oxycontin) and hydromorphone amongst others.  Long term and even short term use of opiods can cause: Increased pain response Dependence Constipation Depression Respiratory depression And more.  Withdrawal symptoms can include Flu like symptoms Nausea, vomiting And more Techniques to manage these symptoms Hydrate  well Eat regular healthy meals Stay active Use relaxation techniques(deep breathing, meditating, yoga) Do Not  substitute Alcohol to help with tapering If you have been on opioids for less than two weeks and do not have pain than it is ok to stop all together.  Plan to wean off of opioids This plan should start within one week post op of your joint replacement. Maintain the same interval or time between taking each dose and first decrease the dose.  Cut the total daily intake of opioids by one tablet each day Next start to increase the time between doses. The last dose that should be eliminated is the evening dose.   MAKE SURE YOU:  Understand these instructions.  Get help right away if you are not doing well or get worse.    Thank you for letting us be a part of your medical care team.  It is a privilege we respect greatly.  We hope these instructions will help you stay on track for a fast and full recovery!            Signed: Imogen Maddalena A Hilmar Moldovan 08/05/2023, 6:23 AM

## 2023-08-05 NOTE — Progress Notes (Signed)
 Physical Therapy Treatment Patient Details Name: Colleen Kemp MRN: [id number] DOB: [birth year] Today's Date: 08/05/2023   History of Present Illness Pt is 58 yo female s/p R TKA on 08/04/23.  Pt with hx including but not limited to arthritis, HTN, and breast lumpectomy.    PT Comments  Pt is POD # 1 and is progressing well.  Pt with sensation and good quad activation today, able to SLR without ext lag so KI not used.  Pt ambulated 100' and performed stairs similar to home set up.  She has good ROM and understanding of HEP.  Pt has outpt PT scheduled later this week. Pt demonstrates safe gait & transfers in order to return home from PT perspective once discharged by MD.  While in hospital, will continue to benefit from PT for skilled therapy to advance mobility and exercises.       If plan is discharge home, recommend the following: A little help with walking and/or transfers;A little help with bathing/dressing/bathroom;Assistance with cooking/housework;Help with stairs or ramp for entrance   Can travel by private vehicle        Equipment Recommendations  Rolling walker (2 wheels)    Recommendations for Other Services       Precautions / Restrictions Precautions Precautions: Fall;Knee Required Braces or Orthoses: Knee Immobilizer - Right Knee Immobilizer - Right: Discontinue once straight leg raise with < 10 degree lag Restrictions RLE Weight Bearing Per Provider Order: Weight bearing as tolerated     Mobility  Bed Mobility Overal bed mobility: Needs Assistance Bed Mobility: Supine to Sit     Supine to sit: Supervision          Transfers Overall transfer level: Needs assistance Equipment used: Rolling walker (2 wheels) Transfers: Sit to/from Stand Sit to Stand: Supervision           General transfer comment: Performed from chair and bed with supervision; good hand placement and R LE management    Ambulation/Gait Ambulation/Gait assistance: Contact guard  assist, Supervision Gait Distance (Feet): 100 Feet Assistive device: Rolling walker (2 wheels)   Gait velocity: decreased but functional     General Gait Details: Step to progressing to step through and progressed from CGA to supervision; good quad activation and sensation today -KI not used; tolerated well wtih steady gait   Stairs Stairs: Yes Stairs assistance: Contact guard assist Stair Management: Step to pattern, Forwards, With walker Number of Stairs: 1 General stair comments: 1 platform 6" step to simulate threshold; CGA for safety; tolerated well forward with RW   Wheelchair Mobility     Tilt Bed    Modified Rankin (Stroke Patients Only)       Balance Overall balance assessment: Needs assistance Sitting-balance support: No upper extremity supported Sitting balance-Leahy Scale: Good     Standing balance support: Bilateral upper extremity supported, Reliant on assistive device for balance, No upper extremity supported Standing balance-Leahy Scale: Fair Standing balance comment: Steady with RW but could static stand without AD                            Communication    Cognition Arousal: Alert Behavior During Therapy: WFL for tasks assessed/performed   PT - Cognitive impairments: No apparent impairments                                Cueing    Exercises Total  Joint Exercises Ankle Circles/Pumps: AROM, Both, 10 reps, Supine Quad Sets: AROM, Both, 10 reps, Supine Heel Slides: AAROM, Right, 10 reps, Supine Hip ABduction/ADduction: AAROM, Right, 10 reps, Supine Long Arc Quad: AROM, Right, 5 reps, Seated Knee Flexion: AROM, Right, 10 reps, Seated Goniometric ROM: R knee 0 to 70 degrees Other Exercises Other Exercises: Educated on [employer] techniques    General Comments General comments (skin integrity, edema, etc.): Able to SLR without lag - KI not used  Educated on safe ice use, no pivots, car transfers, resting with leg straight,  and TED hose during day. Also, encouraged walking every 1-2 hours during day. Educated on HEP with focus on mobility the first weeks. Discussed doing exercises within pain control and if pain increasing could decreased ROM, reps, and stop exercises as needed. Encouraged to perform quad sets and ankle pumps frequently for blood flow and to promote full knee extension.      Pertinent Vitals/Pain Pain Assessment Pain Assessment: 0-10 Pain Score: 2  Pain Descriptors / Indicators: Burning, Tightness Pain Intervention(s): Limited activity within patient's tolerance, Monitored during session, Premedicated before session, Repositioned, Ice applied    Home Living                          Prior Function            PT Goals (current goals can now be found in the care plan section) Progress towards PT goals: Progressing toward goals    Frequency    7X/week      PT Plan      Co-evaluation              AM-PAC PT "6 Clicks" Mobility   Outcome Measure  Help needed turning from your back to your side while in a flat bed without using bedrails?: A Little Help needed moving from lying on your back to sitting on the side of a flat bed without using bedrails?: A Little Help needed moving to and from a bed to a chair (including a wheelchair)?: A Little Help needed standing up from a chair using your arms (e.g., wheelchair or bedside chair)?: A Little Help needed to walk in hospital room?: A Little Help needed climbing 3-5 steps with a railing? : A Little 6 Click Score: 18    End of Session Equipment Utilized During Treatment: Gait belt Activity Tolerance: Patient tolerated treatment well Patient left: in chair;with call bell/phone within reach Nurse Communication: Mobility status PT Visit Diagnosis: Other abnormalities of gait and mobility (R26.89);Muscle weakness (generalized) (M62.81)     Time: 1610-9604 PT Time Calculation (min) (ACUTE ONLY): 28 min  Charges:     $Gait Training: 8-22 mins $Therapeutic Exercise: 8-22 mins PT General Charges $$ ACUTE PT VISIT: 1 Visit                     Anise Salvo, PT Acute Rehab [facility] [phone]    Rayetta Humphrey 08/05/2023, 12:43 PM
